# Patient Record
Sex: Female | Born: 1950 | Race: Black or African American | Hispanic: No | Marital: Single | Smoking: Never smoker
Health system: Southern US, Community
[De-identification: ages and names within clinical notes are randomized; demographics above are authoritative.]

## PROBLEM LIST (undated history)

## (undated) DIAGNOSIS — R7303 Prediabetes: Secondary | ICD-10-CM

## (undated) DIAGNOSIS — R0600 Dyspnea, unspecified: Secondary | ICD-10-CM

## (undated) DIAGNOSIS — N289 Disorder of kidney and ureter, unspecified: Secondary | ICD-10-CM

## (undated) DIAGNOSIS — D649 Anemia, unspecified: Secondary | ICD-10-CM

## (undated) DIAGNOSIS — Z87442 Personal history of urinary calculi: Secondary | ICD-10-CM

## (undated) DIAGNOSIS — E119 Type 2 diabetes mellitus without complications: Secondary | ICD-10-CM

## (undated) DIAGNOSIS — I10 Essential (primary) hypertension: Secondary | ICD-10-CM

## (undated) DIAGNOSIS — E039 Hypothyroidism, unspecified: Secondary | ICD-10-CM

---

## 2011-11-22 ENCOUNTER — Emergency Department (HOSPITAL_BASED_OUTPATIENT_CLINIC_OR_DEPARTMENT_OTHER)
Admission: EM | Admit: 2011-11-22 | Discharge: 2011-11-22 | Disposition: A | Discharge status: Other Acute Inpatient Hospital | Payer: BC Managed Care – PPO | Attending: Emergency Medicine | Admitting: Emergency Medicine

## 2011-11-22 ENCOUNTER — Emergency Department (HOSPITAL_BASED_OUTPATIENT_CLINIC_OR_DEPARTMENT_OTHER): Payer: BC Managed Care – PPO

## 2011-11-22 ENCOUNTER — Encounter (HOSPITAL_BASED_OUTPATIENT_CLINIC_OR_DEPARTMENT_OTHER): Payer: Self-pay | Admitting: *Deleted

## 2011-11-22 DIAGNOSIS — K5090 Crohn's disease, unspecified, without complications: Secondary | ICD-10-CM | POA: Insufficient documentation

## 2011-11-22 DIAGNOSIS — N289 Disorder of kidney and ureter, unspecified: Secondary | ICD-10-CM | POA: Insufficient documentation

## 2011-11-22 DIAGNOSIS — K529 Noninfective gastroenteritis and colitis, unspecified: Secondary | ICD-10-CM

## 2011-11-22 DIAGNOSIS — I10 Essential (primary) hypertension: Secondary | ICD-10-CM | POA: Insufficient documentation

## 2011-11-22 DIAGNOSIS — R55 Syncope and collapse: Secondary | ICD-10-CM | POA: Insufficient documentation

## 2011-11-22 DIAGNOSIS — R1900 Intra-abdominal and pelvic swelling, mass and lump, unspecified site: Secondary | ICD-10-CM | POA: Insufficient documentation

## 2011-11-22 DIAGNOSIS — Z79899 Other long term (current) drug therapy: Secondary | ICD-10-CM | POA: Insufficient documentation

## 2011-11-22 DIAGNOSIS — E209 Hypoparathyroidism, unspecified: Secondary | ICD-10-CM | POA: Insufficient documentation

## 2011-11-22 DIAGNOSIS — R51 Headache: Secondary | ICD-10-CM | POA: Insufficient documentation

## 2011-11-22 DIAGNOSIS — E119 Type 2 diabetes mellitus without complications: Secondary | ICD-10-CM | POA: Insufficient documentation

## 2011-11-22 HISTORY — DX: Essential (primary) hypertension: I10

## 2011-11-22 HISTORY — DX: Hypothyroidism, unspecified: E03.9

## 2011-11-22 HISTORY — DX: Type 2 diabetes mellitus without complications: E11.9

## 2011-11-22 LAB — URINALYSIS, ROUTINE W REFLEX MICROSCOPIC
Glucose, UA: NEGATIVE mg/dL
Specific Gravity, Urine: 1.044 — ABNORMAL HIGH (ref 1.005–1.030)

## 2011-11-22 LAB — GLUCOSE, CAPILLARY: Glucose-Capillary: 129 mg/dL — ABNORMAL HIGH (ref 70–99)

## 2011-11-22 LAB — CBC
MCH: 27 pg (ref 26.0–34.0)
MCHC: 33.4 g/dL (ref 30.0–36.0)
Platelets: 307 10*3/uL (ref 150–400)
RBC: 4.88 MIL/uL (ref 3.87–5.11)

## 2011-11-22 LAB — COMPREHENSIVE METABOLIC PANEL
ALT: 27 U/L (ref 0–35)
AST: 28 U/L (ref 0–37)
Alkaline Phosphatase: 108 U/L (ref 39–117)
CO2: 28 mEq/L (ref 19–32)
Calcium: 5.6 mg/dL — CL (ref 8.4–10.5)
Potassium: 3.2 mEq/L — ABNORMAL LOW (ref 3.5–5.1)
Sodium: 141 mEq/L (ref 135–145)
Total Protein: 8 g/dL (ref 6.0–8.3)

## 2011-11-22 LAB — URINE MICROSCOPIC-ADD ON

## 2011-11-22 LAB — TSH: TSH: 1.3 u[IU]/mL (ref 0.350–4.500)

## 2011-11-22 LAB — CALCIUM, IONIZED: Calcium, Ion: 0.59 mmol/L — CL (ref 1.12–1.32)

## 2011-11-22 LAB — PHOSPHORUS: Phosphorus: 6.2 mg/dL — ABNORMAL HIGH (ref 2.3–4.6)

## 2011-11-22 LAB — MAGNESIUM: Magnesium: 1.9 mg/dL (ref 1.5–2.5)

## 2011-11-22 MED ORDER — POTASSIUM CHLORIDE CRYS ER 20 MEQ PO TBCR
40.0000 meq | EXTENDED_RELEASE_TABLET | Freq: Once | ORAL | Status: AC
  Administered 2011-11-22: 40 meq via ORAL
  Filled 2011-11-22: qty 2

## 2011-11-22 MED ORDER — SODIUM CHLORIDE 0.9 % IV BOLUS (SEPSIS)
1000.0000 mL | Freq: Once | INTRAVENOUS | Status: AC
  Administered 2011-11-22: 1000 mL via INTRAVENOUS

## 2011-11-22 MED ORDER — IOHEXOL 300 MG/ML  SOLN
25.0000 mL | INTRAMUSCULAR | Status: AC
  Administered 2011-11-22 (×2): 25 mL via ORAL

## 2011-11-22 MED ORDER — IOHEXOL 300 MG/ML  SOLN
100.0000 mL | Freq: Once | INTRAMUSCULAR | Status: AC | PRN
  Administered 2011-11-22: 100 mL via INTRAVENOUS

## 2011-11-22 MED ORDER — CALCIUM GLUCONATE 10 % IV SOLN
1.0000 g | Freq: Once | INTRAVENOUS | Status: AC
  Administered 2011-11-22: 1 g via INTRAVENOUS
  Filled 2011-11-22: qty 10

## 2011-11-22 NOTE — ED Notes (Signed)
Angelique Blonder called back with Bed- 617 @ [facility] and [facility] is on their way for transport.

## 2011-11-22 NOTE — ED Notes (Signed)
Dr. Denton Lank has spoken to [facility] Dr. Harmon Dun at [facility] for admission.  Waiting to hear from [facility] on transportation.

## 2011-11-22 NOTE — ED Notes (Signed)
Pt incontinent loose stool in bed.  Provided skin care and changed linen.

## 2011-11-22 NOTE — ED Notes (Signed)
Patient had a seizure like activity for about 5 sec.  Patient's body became stiff and was foaming at the mouth.  Patient is now alert and oriented x 3.  No known history of seizure

## 2011-11-22 NOTE — ED Notes (Signed)
Patient made aware of urinalysis specimen

## 2011-11-22 NOTE — ED Provider Notes (Addendum)
History     CSN: [id number]  Arrival date & time 11/22/11  4098   None    Chief complaint: abdominal pain   (Consider location/radiation/quality/duration/timing/severity/associated sxs/prior treatment) Patient is a 61 y.o. female presenting with abdominal pain. The history is provided by the patient.  Abdominal Pain The primary symptoms of the illness include abdominal pain. The primary symptoms of the illness do not include fever, shortness of breath, vomiting, diarrhea or dysuria.  Symptoms associated with the illness do not include chills or back pain.  pt c/o mid to upper abd pain for past week, 'on and off'. Denies specific exacerbating or alleviating factors. Denies prior same pain. Lasts minutes to hours. No associated vomiting. States having regular bms, but small.  States feels if had large bm would feel better. Decreased appetite. Denies fever or chills. No gu c/o. No radiation of pain or back/flank pain. No hx pud, gallstones or pancreatitis. No prior abd surgery. No distension. Denies chest pain or sob.     Past Medical History  Diagnosis Date  . Hypertension   . Diabetes mellitus without complication     History reviewed. No pertinent past surgical history.  No family history on file.  History  Substance Use Topics  . Smoking status: Never Smoker   . Smokeless tobacco: Not on file  . Alcohol Use: No    OB History    Grav Para Term Preterm Abortions TAB SAB Ect Mult Living                  Review of Systems  Constitutional: Negative for fever and chills.  HENT: Negative for neck pain.   Eyes: Negative for redness.  Respiratory: Negative for cough and shortness of breath.   Cardiovascular: Negative for chest pain.  Gastrointestinal: Positive for abdominal pain. Negative for vomiting and diarrhea.  Genitourinary: Negative for dysuria and flank pain.  Musculoskeletal: Negative for back pain.  Skin: Negative for rash.  Neurological: Negative for  headaches.  Hematological: Does not bruise/bleed easily.  Psychiatric/Behavioral: Negative for confusion.    Allergies  Penicillins  Home Medications   Current Outpatient Rx  Name  Route  Sig  Dispense  Refill  . ATORVASTATIN CALCIUM 80 MG PO TABS   Oral   Take 80 mg by mouth daily.         Marland Kitchen CARVEDILOL 25 MG PO TABS   Oral   Take 25 mg by mouth 2 (two) times daily with a meal.         . LEVOTHYROXINE SODIUM 300 MCG PO TABS   Oral   Take 300 mcg by mouth daily.         Marland Kitchen LISINOPRIL 40 MG PO TABS   Oral   Take 40 mg by mouth daily.         Marland Kitchen SITAGLIPTIN PHOSPHATE 50 MG PO TABS   Oral   Take 50 mg by mouth daily.         Marland Kitchen LEVOFLOXACIN 500 MG PO TABS   Oral   Take 500 mg by mouth daily.         Marland Kitchen LEVOTHYROXINE SODIUM 200 MCG PO TABS   Oral   Take 200 mcg by mouth daily.         Marland Kitchen NITROFURANTOIN MACROCRYSTAL 100 MG PO CAPS   Oral   Take 100 mg by mouth 4 (four) times daily.           BP 159/96  Pulse 89  Temp  98.1 F (36.7 C) (Oral)  Resp 17  SpO2 96%  Physical Exam  Nursing note and vitals reviewed. Constitutional: She is oriented to person, place, and time. She appears well-developed and well-nourished. No distress.  HENT:  Mouth/Throat: Oropharynx is clear and moist.  Eyes: Conjunctivae normal are normal. No scleral icterus.  Neck: Neck supple. No tracheal deviation present.  Cardiovascular: Normal rate, regular rhythm, normal heart sounds and intact distal pulses.   Pulmonary/Chest: Effort normal and breath sounds normal. No respiratory distress.  Abdominal: Soft. Normal appearance and bowel sounds are normal. She exhibits no distension and no mass. There is no tenderness. There is no rebound and no guarding.  Genitourinary:       No cva tenderness  Musculoskeletal: She exhibits no edema and no tenderness.  Neurological: She is alert and oriented to person, place, and time.       Motor intact bil. Steady gait.   Skin: Skin is warm  and dry. No rash noted.  Psychiatric: She has a normal mood and affect.    ED Course  Procedures (including critical care time)  Results for orders placed during the hospital encounter of 11/22/11  GLUCOSE, CAPILLARY      Component Value Range   Glucose-Capillary 129 (*) 70 - 99 mg/dL   Comment 1 Documented in Chart     Comment 2 Notify RN    CBC      Component Value Range   WBC 17.6 (*) 4.0 - 10.5 K/uL   RBC 4.88  3.87 - 5.11 MIL/uL   Hemoglobin 13.2  12.0 - 15.0 g/dL   HCT 40.9  81.1 - 91.4 %   MCV 80.9  78.0 - 100.0 fL   MCH 27.0  26.0 - 34.0 pg   MCHC 33.4  30.0 - 36.0 g/dL   RDW 78.2  95.6 - 21.3 %   Platelets 307  150 - 400 K/uL  COMPREHENSIVE METABOLIC PANEL      Component Value Range   Sodium 141  135 - 145 mEq/L   Potassium 3.2 (*) 3.5 - 5.1 mEq/L   Chloride 94 (*) 96 - 112 mEq/L   CO2 28  19 - 32 mEq/L   Glucose, Bld 131 (*) 70 - 99 mg/dL   BUN 17  6 - 23 mg/dL   Creatinine, Ser 0.86 (*) 0.50 - 1.10 mg/dL   Calcium 5.6 (*) 8.4 - 10.5 mg/dL   Total Protein 8.0  6.0 - 8.3 g/dL   Albumin 3.0 (*) 3.5 - 5.2 g/dL   AST 28  0 - 37 U/L   ALT 27  0 - 35 U/L   Alkaline Phosphatase 108  39 - 117 U/L   Total Bilirubin 0.3  0.3 - 1.2 mg/dL   GFR calc non Af Amer 48 (*) >90 mL/min   GFR calc Af Amer 55 (*) >90 mL/min  LIPASE, BLOOD      Component Value Range   Lipase 17  11 - 59 U/L  MAGNESIUM      Component Value Range   Magnesium 1.9  1.5 - 2.5 mg/dL  PHOSPHORUS      Component Value Range   Phosphorus 6.2 (*) 2.3 - 4.6 mg/dL   Dg Abd 1 View  57/84/6962  *RADIOLOGY REPORT*  Clinical Data: Pain/vomiting  ABDOMEN - 1 VIEW  Comparison: None.  Findings: Nonspecific bowel gas pattern without a portion of small bowel dilatation to suggest small bowel obstruction.  Degenerative changes of the visualized thoracolumbar spine.  IMPRESSION: Unremarkable abdominal radiograph.   Original Report Authenticated By: Charline Bills, M.D.    Ct Abdomen Pelvis W  Contrast  11/22/2011  *RADIOLOGY REPORT*  Clinical Data: Abdominal pain  CT ABDOMEN AND PELVIS WITH CONTRAST  Technique:  Multidetector CT imaging of the abdomen and pelvis was performed following the standard protocol during bolus administration of intravenous contrast.  Contrast: OMNIPAQUE IOHEXOL 300 MG/ML  SOLN  Comparison: None.  Findings: Mild dependent atelectasis at the lung bases.  Liver, spleen, pancreas, and adrenal glands are within normal limits.  Gallbladder is unremarkable.  No intrahepatic or extrahepatic ductal dilatation.  Multiple complex/hyperdense renal lesions, including: --1.9 x 1.9 cm lesion in the right upper kidney (series 2/image 27) --1.2 x 1.3 cm lesion in the left upper kidney (series 2/image 28) --1.9 x 1.6 cm lesion in the left lower kidney (series 2/image 36)  All of these lesions could reflect hemorrhagic cysts but are considered indeterminate.  No hydronephrosis.  No evidence of bowel obstruction.  Abnormal wall thickening involving a long segment of distal small bowel including the terminal ileum (series 2/image 56).  Normal appendix.  Colonic diverticulosis, without definite associated inflammatory changes.  No evidence of abdominal aortic aneurysm.  Small retroperitoneal lymph nodes which do not meet pathologic CT size criteria.  Uterus is notable for calcified uterine fibroids.  Right ovary is unremarkable.  Complex left adnexal lesion measuring 4.5 x 6.0 cm with suspected associated hydrosalpinx (series 2/image 55).  Bladder is within normal limits.  Degenerative changes of the visualized thoracolumbar spine.  IMPRESSION: Abnormal distal small bowel, suspicious for infectious/inflammatory enteritis.  4.5 x 6.0 cm complex left adnexal lesion with suspected associated hydrosalpinx. Consider follow-up pelvic MRI with/without contrast (preferred) or ultrasound for further characterization.  Complex bilateral renal lesions, described above, possibly reflecting a hemorrhagic  cyst but indeterminate.  Follow-up MRI abdomen with/without contrast is suggested for definitive characterization.  These results were called by telephone on 11/22/2011 at 1150 hours to Dr Cathren Laine, who verbally acknowledged these results.   Original Report Authenticated By: Charline Bills, M.D.         MDM  Iv ns bolus. Labs.   Reviewed nursing notes and prior charts for additional history.   Just after iv stick, lab draw, pt indicate felt warm/hot, flushed, brief syncopal event. On entering room, pt awake and alert, no sz activity, no postictal confusion, pts mental status c/w baseline. Denies headache. Denies increased abd pain. No cp. No sob. No palpitations.  Monitor.   Ca low. Additional labs added. Ca gluconate iv.    Date: 11/22/2011  Rate: 79  Rhythm: normal sinus rhythm  QRS Axis: normal  Intervals: QT prolonged  ST/T Wave abnormalities: normal  Conduction Disutrbances:none  Narrative Interpretation:   Old EKG Reviewed: none available  Given abd pain, elevated wbc, will get ct.   Coworker arrives, provides additional hx, hx hypothyroid, on synthroid, pt unclear as to whether taking regularly. Will check tsh/t4.   Discussed diff dx w pt including hypoparathyroidism.   Iv fluids, ca in ed.   Recheck abd soft, mild mid to upper abd tenderness. No rebound or guarding.  No fevers. No hx ibd,   Discussed ct abnormalities noted w pt.   Pt states pcp is Dr Derrell Lolling w [facility], and that she prefers admission to [facility].  Discussed pt with Dr Leavy Cella, hospitalist on call, who accepts pt to [facility], she requests gen med bed.  Suzi Roots, MD 11/22/11 1239  Suzi Roots, MD 11/22/11 [id number]

## 2011-11-22 NOTE — ED Notes (Signed)
Patient transported to CT 

## 2011-11-22 NOTE — ED Notes (Signed)
Per family member, patient has not felt well all week.  Patient has been vomiting and stayed home.  The last time she was herself was last Saturday.  Speech is slightly slurred per family

## 2011-11-22 NOTE — ED Notes (Addendum)
Patient here with c/o not feeling like her self for about a week.  States that her head and her stomach do not feel right.  Patient denies any nausea or vomiting.  Patient states that her abdomen feels tight.  Patient denies any cold like symptoms.  Alert and oriented x 3.

## 2011-11-22 NOTE — ED Notes (Signed)
Paged Dr. Derrell Lolling ([facility]) for Dr. Denton Lank for admission at [facility]

## 2011-11-23 LAB — PARATHYROID HORMONE, INTACT (NO CA): PTH: 25.2 pg/mL (ref 14.0–72.0)

## 2011-11-23 LAB — GC/CHLAMYDIA PROBE AMP: GC Probe RNA: NEGATIVE

## 2011-11-24 LAB — URINE CULTURE: Colony Count: >=100000

## 2015-02-13 DIAGNOSIS — E892 Postprocedural hypoparathyroidism: Secondary | ICD-10-CM | POA: Insufficient documentation

## 2015-08-08 DIAGNOSIS — E890 Postprocedural hypothyroidism: Secondary | ICD-10-CM | POA: Insufficient documentation

## 2016-05-07 DIAGNOSIS — R7303 Prediabetes: Secondary | ICD-10-CM | POA: Insufficient documentation

## 2016-05-07 DIAGNOSIS — E669 Obesity, unspecified: Secondary | ICD-10-CM | POA: Insufficient documentation

## 2016-05-07 DIAGNOSIS — E782 Mixed hyperlipidemia: Secondary | ICD-10-CM | POA: Insufficient documentation

## 2016-05-07 DIAGNOSIS — I10 Essential (primary) hypertension: Secondary | ICD-10-CM | POA: Insufficient documentation

## 2016-05-08 DIAGNOSIS — N289 Disorder of kidney and ureter, unspecified: Secondary | ICD-10-CM | POA: Insufficient documentation

## 2016-05-08 DIAGNOSIS — E876 Hypokalemia: Secondary | ICD-10-CM | POA: Insufficient documentation

## 2016-05-26 ENCOUNTER — Ambulatory Visit (INDEPENDENT_AMBULATORY_CARE_PROVIDER_SITE_OTHER): Payer: Medicare Other | Admitting: Family Medicine

## 2016-05-26 ENCOUNTER — Encounter: Payer: Self-pay | Admitting: Family Medicine

## 2016-05-26 DIAGNOSIS — M7989 Other specified soft tissue disorders: Secondary | ICD-10-CM | POA: Diagnosis not present

## 2016-05-26 NOTE — Patient Instructions (Signed)
From an orthopedic standpoint you're doing ok. The fluid in your legs is likely due to a combination of veins not working well enough (venous insufficiency) and kidney insufficiency. Talk to your kidney doctor about whether you can start a fluid pill like lasix. Compression with stockings (typically 20-65mmHg pressure) helps keep this down. Icing or cool bath 15 minutes at a time 3-4 times a day. Elevation above your heart as much as possible. Follow up with me as needed.

## 2016-05-29 DIAGNOSIS — M7989 Other specified soft tissue disorders: Secondary | ICD-10-CM | POA: Insufficient documentation

## 2016-05-29 NOTE — Progress Notes (Signed)
PCP: Lilian Coma., MD  Subjective:   HPI: Patient is a 66 y.o. female here for bilateral foot pain.  Patient reports she's had several years of pain and swelling in both feet. Dating back to at least 2009. Both legs feel the same. Has difficulty walking. Pain can get to a 10/10 around both ankles. Currently 7/10 and stiff on right, 0/10 on left. She does have some shortness of breath when walking. No skin changes.  Past Medical History:  Diagnosis Date  . Diabetes mellitus without complication ([place])   . Hypertension   . Hypothyroid     Current Outpatient Prescriptions on File Prior to Visit  Medication Sig Dispense Refill  . atorvastatin (LIPITOR) 80 MG tablet Take 80 mg by mouth daily.    . carvedilol (COREG) 25 MG tablet Take 25 mg by mouth 2 (two) times daily with a meal.    . levothyroxine (SYNTHROID, LEVOTHROID) 200 MCG tablet Take 200 mcg by mouth daily.    Marland Kitchen lisinopril (PRINIVIL,ZESTRIL) 40 MG tablet Take 40 mg by mouth daily.    . sitaGLIPtin (JANUVIA) 50 MG tablet Take 50 mg by mouth daily.     No current facility-administered medications on file prior to visit.     No past surgical history on file.  Allergies  Allergen Reactions  . Penicillins     rash    Social History   Social History  . Marital status: Single    Spouse name: N/A  . Number of children: N/A  . Years of education: N/A   Occupational History  . Not on file.   Social History Main Topics  . Smoking status: Never Smoker  . Smokeless tobacco: Never Used  . Alcohol use No  . Drug use: Unknown  . Sexual activity: Not on file   Other Topics Concern  . Not on file   Social History Narrative  . No narrative on file    No family history on file.  BP (!) 151/94   Pulse 70   Ht 5\' 1"  (1.549 m)   Wt 220 lb (99.8 kg)   BMI 41.57 kg/m   Review of Systems: See HPI above.     Objective:  Physical Exam:  Gen: NAD, comfortable in exam room  Lungs: CTAB without wheezes,  rales, rhonchi.  Bilateral feet/ankles: 3+ pitting edema lower legs into feet and ankles.  No other deformity.  No bruising. FROM ankles without pain. 5/5 strength all motions also without pain. TTP mildly throughout lower legs. Negative ant drawer and talar tilt.   Negative syndesmotic compression. Thompsons test negative. NV intact distally.   Assessment & Plan:  1. Bilateral leg swelling - discussed with patient from orthopedic standpoint her exam is benign.  With MSK u/s also evaluated ankles and no evidence tenosynovitis, ankle effusions, other abnormalities.  She does have increased creatinine and likely venous insufficiency.  She is going to see a renal physician early next month - advised to discuss with them.  Lungs without rales to suggest CHF.  Consider lasix, compression stockings.  Icing, elevation.  Tylenol as needed for pain.  F/u prn.

## 2016-05-29 NOTE — Assessment & Plan Note (Signed)
discussed with patient from orthopedic standpoint her exam is benign.  With MSK u/s also evaluated ankles and no evidence tenosynovitis, ankle effusions, other abnormalities.  She does have increased creatinine and likely venous insufficiency.  She is going to see a renal physician early next month - advised to discuss with them.  Lungs without rales to suggest CHF.  Consider lasix, compression stockings.  Icing, elevation.  Tylenol as needed for pain.  F/u prn.

## 2018-12-16 ENCOUNTER — Other Ambulatory Visit: Payer: Self-pay | Admitting: Internal Medicine

## 2018-12-16 MED ORDER — OXYCODONE-ACETAMINOPHEN 5-325 MG PO TABS: 1.0000 | ORAL_TABLET | Freq: Four times a day (QID) | ORAL | 0 refills | Status: AC | PRN

## 2018-12-19 ENCOUNTER — Non-Acute Institutional Stay (SKILLED_NURSING_FACILITY): Payer: Medicare Other | Admitting: Internal Medicine

## 2018-12-19 ENCOUNTER — Encounter: Payer: Self-pay | Admitting: Internal Medicine

## 2018-12-19 DIAGNOSIS — E782 Mixed hyperlipidemia: Secondary | ICD-10-CM

## 2018-12-19 DIAGNOSIS — M1611 Unilateral primary osteoarthritis, right hip: Secondary | ICD-10-CM | POA: Diagnosis not present

## 2018-12-19 DIAGNOSIS — I10 Essential (primary) hypertension: Secondary | ICD-10-CM | POA: Diagnosis not present

## 2018-12-19 DIAGNOSIS — E890 Postprocedural hypothyroidism: Secondary | ICD-10-CM

## 2018-12-19 DIAGNOSIS — Z96641 Presence of right artificial hip joint: Secondary | ICD-10-CM

## 2018-12-19 DIAGNOSIS — E892 Postprocedural hypoparathyroidism: Secondary | ICD-10-CM

## 2018-12-19 DIAGNOSIS — N189 Chronic kidney disease, unspecified: Secondary | ICD-10-CM

## 2018-12-19 DIAGNOSIS — N179 Acute kidney failure, unspecified: Secondary | ICD-10-CM | POA: Diagnosis not present

## 2018-12-19 DIAGNOSIS — N3000 Acute cystitis without hematuria: Secondary | ICD-10-CM

## 2018-12-19 NOTE — Progress Notes (Signed)
: Provider:  Hennie Duos Location:  [facility] Room Number: 111-P Place of Service:  SNF (31)  PCP: Lilian Coma., MD Patient Care Team: Lilian Coma., MD as PCP - General (Internal Medicine)  Extended Emergency Contact Information Primary Emergency Contact: Rexene Alberts of [place] Phone: [phone] Relation: Friend     Allergies: Penicillins  Chief Complaint  Patient presents with  . New Admit To SNF    New admission to [facility] SNF    HPI: Patient is a 68 y.o. female with hypertension, prediabetes, stage IV chronic kidney disease, and hypothyroidism, who was admitted to [facility] from 12/7-11 for a planned right hip arthroplasty, secondary to end-stage osteoarthritis.  Hospital course was complicated issues with her renal insufficiency.  Her creatinine rose to 2.77 on her third postop day and IV fluids were continued.  On the fourth day her creatinine trended down to 1.76.  Patient also was diagnosed with a UTI and will be treated as an outpatient for same patient is admitted to skilled nursing facility for OT/PT.  While at skilled nursing facility patient will be followed for hypertension treated with Norvasc Coreg hydralazine and lisinopril, hypothyroidism treated with replacement, and hyperlipidemia treated with Lipitor.  Past Medical History:  Diagnosis Date  . Diabetes mellitus without complication ([place])   . Hypertension   . Hypothyroid     No past surgical history on file.  Allergies as of 12/19/2018      Reactions   Penicillins    rash      Medication List       Accurate as of December 19, 2018 10:17 AM. If you have any questions, ask your nurse or doctor.        STOP taking these medications   sitaGLIPtin 50 MG tablet Commonly known as: JANUVIA Stopped by: Inocencio Homes, MD     TAKE these medications   amlodipine-atorvastatin 10-10 MG tablet Commonly known as:  CADUET Take 1 tablet by mouth daily.   aspirin 81 MG chewable tablet Chew by mouth 2 (two) times daily.   atorvastatin 20 MG tablet Commonly known as: LIPITOR Take 20 mg by mouth daily. What changed: Another medication with the same name was removed. Continue taking this medication, and follow the directions you see here. Changed by: Inocencio Homes, MD   calcium carbonate 500 MG chewable tablet Commonly known as: TUMS - dosed in mg elemental calcium Chew 1 tablet by mouth 3 (three) times daily with meals.   calcium-vitamin D 500-200 MG-UNIT tablet Commonly known as: OSCAL WITH D Take 1 tablet by mouth 2 (two) times daily.   carvedilol 25 MG tablet Commonly known as: COREG Take 25 mg by mouth 2 (two) times daily with a meal.   hydrALAZINE 100 MG tablet Commonly known as: APRESOLINE Take 100 mg by mouth 3 (three) times daily.   levothyroxine 175 MCG tablet Commonly known as: SYNTHROID Take 175 mcg by mouth daily before breakfast. What changed: Another medication with the same name was removed. Continue taking this medication, and follow the directions you see here. Changed by: Inocencio Homes, MD   lisinopril 40 MG tablet Commonly known as: ZESTRIL Take 40 mg by mouth daily.   oxyCODONE-acetaminophen 5-325 MG tablet Commonly known as: PERCOCET/ROXICET Take 1 tablet by mouth every 6 (six) hours as needed for up to 7 days for severe pain.   sulfamethoxazole-trimethoprim 800-160 MG tablet Commonly known as: BACTRIM DS  Take 1 tablet by mouth 2 (two) times daily.   vitamin B-12 1000 MCG tablet Commonly known as: CYANOCOBALAMIN Take 1,000 mcg by mouth daily.       No orders of the defined types were placed in this encounter.   Immunization History  Administered Date(s) Administered  . PPD Test 05/04/2013    Social History   Tobacco Use  . Smoking status: Never Smoker  . Smokeless tobacco: Never Used  Substance Use Topics  . Alcohol use: No    Family  history is positive for heart disease in the mom and diabetes and cancer in the dad.  No family history on file.    Review of Systems  GENERAL:  no fevers, fatigue, appetite changes SKIN: Wound looks fine but there is clear drainage from it, without odor EYES: No eye pain, redness, discharge EARS: No earache, tinnitus, change in hearing NOSE: No congestion, drainage or bleeding  MOUTH/THROAT: No mouth or tooth pain, No sore throat RESPIRATORY: No cough, wheezing, SOB CARDIAC: No chest pain, palpitations, lower extremity edema  GI: No abdominal pain, No N/V/D or constipation, No heartburn or reflux  GU: No dysuria, frequency or urgency, or incontinence  MUSCULOSKELETAL: No unrelieved bone/joint pain NEUROLOGIC: No headache, dizziness or focal weakness PSYCHIATRIC: No c/o anxiety or sadness   Vitals:   12/19/18 0949  BP: 110/62  Pulse: 85  Resp: 18  Temp: 98.4 F (36.9 C)  SpO2: 97%    SpO2 Readings from Last 1 Encounters:  12/19/18 97%   Body mass index is 37.41 kg/m.     Physical Exam  GENERAL APPEARANCE: Alert, conversant,  No acute distress.  SKIN: Incision site dressed and not visualized HEAD: Normocephalic, atraumatic  EYES: Conjunctiva/lids clear. Pupils round, reactive. EOMs intact.  EARS: External exam WNL, canals clear. Hearing grossly normal.  NOSE: No deformity or discharge.  MOUTH/THROAT: Lips w/o lesions  RESPIRATORY: Breathing is even, unlabored. Lung sounds are clear   CARDIOVASCULAR: Heart RRR no murmurs, rubs or gallops. No peripheral edema.   GASTROINTESTINAL: Abdomen is soft, non-tender, not distended w/ normal bowel sounds. GENITOURINARY: Bladder non tender, not distended  MUSCULOSKELETAL: No abnormal joints or musculature NEUROLOGIC:  Cranial nerves 2-12 grossly intact. Moves all extremities  PSYCHIATRIC: Mood and affect appropriate to situation, no behavioral issues  Patient Active Problem List   Diagnosis Date Noted  . Leg swelling  05/29/2016  . Hypokalemia 05/08/2016  . Renal insufficiency 05/08/2016  . Mixed hyperlipidemia 05/07/2016  . Obesity (BMI 30-39.9) 05/07/2016  . Prediabetes 05/07/2016  . Uncontrolled hypertension 05/07/2016  . 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 Hypocalcemia 02/13/2015  . 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[place]) 02/13/2015      Labs reviewed: Basic Metabolic Panel:    Component Value Date/Time   NA 141 11/22/2011 0812   K 3.2 (L) 11/22/2011 0812   CL 94 (L) 11/22/2011 0812   CO2 28 11/22/2011 0812   GLUCOSE 131 (H) 11/22/2011 0812   BUN 17 11/22/2011 0812   CREATININE 1.20 (H) 11/22/2011 0812   CALCIUM 5.6 (LL) 11/22/2011 0812   PROT 8.0 11/22/2011 0812   ALBUMIN 3.0 (L) 11/22/2011 0812   AST 28 11/22/2011 0812   ALT 27 11/22/2011 0812   ALKPHOS 108 11/22/2011 0812   BILITOT 0.3 11/22/2011 0812   GFRNONAA 48 (L) 11/22/2011 0812   GFRAA 55 (L) 11/22/2011 0812    No results for input(s): NA, K, CL, CO2, GLUCOSE, BUN, CREATININE, CALCIUM, MG, PHOS in the last 8760 hours. Liver Function Tests: No  results for input(s): AST, ALT, ALKPHOS, BILITOT, PROT, ALBUMIN in the last 8760 hours. No results for input(s): LIPASE, AMYLASE in the last 8760 hours. No results for input(s): AMMONIA in the last 8760 hours. CBC: No results for input(s): WBC, NEUTROABS, HGB, HCT, MCV, PLT in the last 8760 hours. Lipid No results for input(s): CHOL, HDL, LDLCALC, TRIG in the last 8760 hours.  Cardiac Enzymes: No results for input(s): CKTOTAL, CKMB, CKMBINDEX, TROPONINI in the last 8760 hours. BNP: No results for input(s): BNP in the last 8760 hours. No results found for: MICROALBUR No results found for: HGBA1C Lab Results  Component Value Date   TSH 1.300 11/22/2011   No results found for: VITAMINB12 No results found for: FOLATE No results found for: IRON, TIBC, FERRITIN  Imaging and Procedures obtained prior to SNF admission: DG Abd 1 View  Result Date:  11/22/2011 *RADIOLOGY REPORT* Clinical Data: Pain/vomiting ABDOMEN - 1 VIEW Comparison: None. Findings: Nonspecific bowel gas pattern without a portion of small bowel dilatation to suggest small bowel obstruction. Degenerative changes of the visualized thoracolumbar spine. IMPRESSION: Unremarkable abdominal radiograph. Original Report Authenticated By: Julian Hy, M.D.   CT Abdomen Pelvis W Contrast  Result Date: 11/22/2011 *RADIOLOGY REPORT* Clinical Data: Abdominal pain CT ABDOMEN AND PELVIS WITH CONTRAST Technique:  Multidetector CT imaging of the abdomen and pelvis was performed following the standard protocol during bolus administration of intravenous contrast. Contrast: 125mL OMNIPAQUE IOHEXOL 300 MG/ML  SOLN Comparison: None. Findings: Mild dependent atelectasis at the lung bases. Liver, spleen, pancreas, and adrenal glands are within normal limits. Gallbladder is unremarkable.  No intrahepatic or extrahepatic ductal dilatation. Multiple complex/hyperdense renal lesions, including: --1.9 x 1.9 cm lesion in the right upper kidney (series 2/image 27) --1.2 x 1.3 cm lesion in the left upper kidney (series 2/image 28) --1.9 x 1.6 cm lesion in the left lower kidney (series 2/image 36) All of these lesions could reflect hemorrhagic cysts but are considered indeterminate.  No hydronephrosis. No evidence of bowel obstruction.  Abnormal wall thickening involving a long segment of distal small bowel including the terminal ileum (series 2/image 56).  Normal appendix.  Colonic diverticulosis, without definite associated inflammatory changes. No evidence of abdominal aortic aneurysm. Small retroperitoneal lymph nodes which do not meet pathologic CT size criteria. Uterus is notable for calcified uterine fibroids.  Right ovary is unremarkable. Complex left adnexal lesion measuring 4.5 x 6.0 cm with suspected associated hydrosalpinx (series 2/image 55). Bladder is within normal limits. Degenerative changes of the  visualized thoracolumbar spine. IMPRESSION: Abnormal distal small bowel, suspicious for infectious/inflammatory enteritis. 4.5 x 6.0 cm complex left adnexal lesion with suspected associated hydrosalpinx. Consider follow-up pelvic MRI with/without contrast (preferred) or ultrasound for further characterization. Complex bilateral renal lesions, described above, possibly reflecting a hemorrhagic cyst but indeterminate.  Follow-up MRI abdomen with/without contrast is suggested for definitive characterization. These results were called by telephone on 11/22/2011 at 1150 hours to Dr Lajean Saver, who verbally acknowledged these results. Original Report Authenticated By: Julian Hy, M.D.     Not all labs, radiology exams or other studies done during hospitalization come through on my EPIC note; however they are reviewed by me.    Assessment and Plan  Right hip arthritis/right hip arthroplasty-patient will be on aspirin 81 mg twice daily for 4 weeks duration.  Patient will wear TED hose during her 4-week post hospitalization SNF-admitted for OT/PT; prophylaxed with ASA 81 mg twice daily for 30 days.  Patient has had clear drainage from the wound  without odor or increased pain, the nurse here at SNF is already spoken to the do hypertension ctor's office.  Patient to follow-up with Dr. Davina Poke in 2 weeks  UTI SNF-patient be treated with Septra twice daily for 7 days  Acute kidney injury-creatinine went up to 2.77 and returned down to 1.7 SNF-follow-up BMP  Hypertension SNF-controlled; continue Norvasc 10 mg daily, Coreg 25 mg twice daily, hydralazine 100 mg 3 times daily and lisinopril 40 mg daily  Hypothyroidism/hypoparathyroidism-postprocedural SNF-not stated as uncontrolled; continue Synthroid 175 mcg daily   Hyperlipidemia SNF-not stated as uncontrolled; continue Lipitor 20 mg daily    Time spent greater than 45 minutes;> 50% of time with patient was spent reviewing records, labs, tests  and studies, counseling and developing plan of care  Hennie Duos, MD

## 2018-12-24 ENCOUNTER — Encounter: Payer: Self-pay | Admitting: Internal Medicine

## 2018-12-24 DIAGNOSIS — N189 Chronic kidney disease, unspecified: Secondary | ICD-10-CM | POA: Insufficient documentation

## 2018-12-24 DIAGNOSIS — Z96641 Presence of right artificial hip joint: Secondary | ICD-10-CM | POA: Insufficient documentation

## 2018-12-24 DIAGNOSIS — M1611 Unilateral primary osteoarthritis, right hip: Secondary | ICD-10-CM | POA: Insufficient documentation

## 2018-12-24 DIAGNOSIS — N179 Acute kidney failure, unspecified: Secondary | ICD-10-CM | POA: Insufficient documentation

## 2018-12-24 DIAGNOSIS — N390 Urinary tract infection, site not specified: Secondary | ICD-10-CM | POA: Insufficient documentation

## 2018-12-26 ENCOUNTER — Non-Acute Institutional Stay (SKILLED_NURSING_FACILITY): Payer: Medicare Other | Admitting: Internal Medicine

## 2018-12-26 DIAGNOSIS — N289 Disorder of kidney and ureter, unspecified: Secondary | ICD-10-CM | POA: Diagnosis not present

## 2019-01-02 ENCOUNTER — Encounter: Payer: Self-pay | Admitting: Internal Medicine

## 2019-01-02 NOTE — Progress Notes (Signed)
Location:   [facility] of Service:   SNF  Provider:Jameshia Hayashida D Sheppard Coil MD  Lilian Coma., MD  Patient Care Team: Lilian Coma., MD as PCP - General (Internal Medicine)  Extended Emergency Contact Information Primary Emergency Contact: Sigurd Sos [place] of [place] Phone: [phone] Relation: Friend    Allergies: Penicillins  Chief Complaint  Patient presents with  . Acute Visit    HPI: Patient is 68 y.o. female who is being seen because on a routine lab patient's creatinine is 1.82 with a scarcity of records and prior creatinines being anywhere from 1.2-1.76.  Past Medical History:  Diagnosis Date  . Diabetes mellitus without complication ([place])   . Hypertension   . Hypothyroid     No past surgical history on file.  Allergies as of 12/26/2018      Reactions   Penicillins    rash      Medication List       Accurate as of December 26, 2018 11:59 PM. If you have any questions, ask your nurse or doctor.        amlodipine-atorvastatin 10-10 MG tablet Commonly known as: CADUET Take 1 tablet by mouth daily.   aspirin 81 MG chewable tablet Chew by mouth 2 (two) times daily.   atorvastatin 20 MG tablet Commonly known as: LIPITOR Take 20 mg by mouth daily.   calcium carbonate 500 MG chewable tablet Commonly known as: TUMS - dosed in mg elemental calcium Chew 1 tablet by mouth 3 (three) times daily with meals.   calcium-vitamin D 500-200 MG-UNIT tablet Commonly known as: OSCAL WITH D Take 1 tablet by mouth 2 (two) times daily.   carvedilol 25 MG tablet Commonly known as: COREG Take 25 mg by mouth 2 (two) times daily with a meal.   hydrALAZINE 100 MG tablet Commonly known as: APRESOLINE Take 100 mg by mouth 3 (three) times daily.   levothyroxine 175 MCG tablet Commonly known as: SYNTHROID Take 175 mcg by mouth daily before breakfast.   lisinopril 40 MG tablet Commonly known as: ZESTRIL Take 40 mg by mouth daily.     vitamin B-12 1000 MCG tablet Commonly known as: CYANOCOBALAMIN Take 1,000 mcg by mouth daily.       No orders of the defined types were placed in this encounter.   Immunization History  Administered Date(s) Administered  . PPD Test 05/04/2013    Social History   Tobacco Use  . Smoking status: Never Smoker  . Smokeless tobacco: Never Used  Substance Use Topics  . Alcohol use: No    Review of Systems  GENERAL:  no fevers, fatigue, appetite changes SKIN: No itching, rash HEENT: No complaint RESPIRATORY: No cough, wheezing, SOB CARDIAC: No chest pain, palpitations, lower extremity edema  GI: No abdominal pain, No N/V/D or constipation, No heartburn or reflux  GU: No dysuria, frequency or urgency, or incontinence  MUSCULOSKELETAL: No unrelieved bone/joint pain NEUROLOGIC: No headache, dizziness  PSYCHIATRIC: No overt anxiety or sadness  Vitals:   01/02/19 0002  BP: 120/60  Pulse: 73  Resp: 18  Temp: 98 F (36.7 C)   There is no height or weight on file to calculate BMI. Physical Exam  GENERAL APPEARANCE: Alert, conversant, No acute distress  SKIN: No diaphoresis rash HEENT: Unremarkable RESPIRATORY: Breathing is even, unlabored. Lung sounds are clear   CARDIOVASCULAR: Heart RRR no murmurs, rubs or gallops. No peripheral edema  GASTROINTESTINAL: Abdomen is soft, non-tender, not distended w/ normal bowel  sounds.  GENITOURINARY: Bladder non tender, not distended  MUSCULOSKELETAL: No abnormal joints or musculature NEUROLOGIC: Cranial nerves 2-12 grossly intact. Moves all extremities PSYCHIATRIC: Mood and affect appropriate to situation, no behavioral issues  Patient Active Problem List   Diagnosis Date Noted  . Osteoarthritis of right hip 12/24/2018  . History of total right hip arthroplasty 12/24/2018  . Acute kidney injury superimposed on chronic kidney disease ([place]) 12/24/2018  . UTI (urinary tract infection) 12/24/2018  . Leg swelling 05/29/2016  .  Hypokalemia 05/08/2016  . Renal insufficiency 05/08/2016  . Mixed hyperlipidemia 05/07/2016  . Obesity (BMI 30-39.9) 05/07/2016  . Prediabetes 05/07/2016  . Hypertension 05/07/2016  . 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 Hypocalcemia 02/13/2015  . 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[facility]) 02/13/2015    CMP     Component Value Date/Time   NA 141 11/22/2011 0812   K 3.2 (L) 11/22/2011 0812   CL 94 (L) 11/22/2011 0812   CO2 28 11/22/2011 0812   GLUCOSE 131 (H) 11/22/2011 0812   BUN 17 11/22/2011 0812   CREATININE 1.20 (H) 11/22/2011 0812   CALCIUM 5.6 (LL) 11/22/2011 0812   PROT 8.0 11/22/2011 0812   ALBUMIN 3.0 (L) 11/22/2011 0812   AST 28 11/22/2011 0812   ALT 27 11/22/2011 0812   ALKPHOS 108 11/22/2011 0812   BILITOT 0.3 11/22/2011 0812   GFRNONAA 48 (L) 11/22/2011 0812   GFRAA 55 (L) 11/22/2011 0812   No results for input(s): NA, K, CL, CO2, GLUCOSE, BUN, CREATININE, CALCIUM, MG, PHOS in the last 8760 hours. No results for input(s): AST, ALT, ALKPHOS, BILITOT, PROT, ALBUMIN in the last 8760 hours. No results for input(s): WBC, NEUTROABS, HGB, HCT, MCV, PLT in the last 8760 hours. No results for input(s): CHOL, LDLCALC, TRIG in the last 8760 hours.  Invalid input(s): HCL No results found for: MICROALBUR Lab Results  Component Value Date   TSH 1.300 11/22/2011   No results found for: HGBA1C No results found for: CHOL, HDL, LDLCALC, LDLDIRECT, TRIG, CHOLHDL  Significant Diagnostic Results in last 30 days:  No results found.  Assessment and Plan  Elevated creatinine-it appears baseline for anywhere from 1.2-1.7; DC lisinopril 40 mg daily; start water I 120 cc every 2 hours x8 times a day while awake; repeat BMP on Monday  Hypocalcemia-patient's calcium is 7.1; patient is already on calcium 3 times daily; will continue to monitor  No problem-specific Assessment & Plan notes found for this encounter.   Labs/tests ordered:    Hennie Duos, MD

## 2019-01-04 ENCOUNTER — Encounter: Payer: Self-pay | Admitting: Internal Medicine

## 2019-01-04 ENCOUNTER — Non-Acute Institutional Stay (SKILLED_NURSING_FACILITY): Payer: Medicare Other | Admitting: Internal Medicine

## 2019-01-04 DIAGNOSIS — Z96641 Presence of right artificial hip joint: Secondary | ICD-10-CM

## 2019-01-04 DIAGNOSIS — N189 Chronic kidney disease, unspecified: Secondary | ICD-10-CM

## 2019-01-04 DIAGNOSIS — E782 Mixed hyperlipidemia: Secondary | ICD-10-CM

## 2019-01-04 DIAGNOSIS — I10 Essential (primary) hypertension: Secondary | ICD-10-CM

## 2019-01-04 DIAGNOSIS — N179 Acute kidney failure, unspecified: Secondary | ICD-10-CM

## 2019-01-04 NOTE — Progress Notes (Signed)
Location:    [facility] Room Number: 109/P Place of Service:  SNF ([id number])  Provider: Lorne Skeens  PCP: Lilian Coma., MD Patient Care Team: Lilian Coma., MD as PCP - General (Internal Medicine)  Extended Emergency Contact Information Primary Emergency Contact: Rexene Alberts of [place] Phone: [phone] Relation: Friend  Code Status: Full Code Goals of care:  Advanced Directive information Advanced Directives 01/04/2019  Does Patient Have a Medical Advance Directive? Yes  Type of Advance Directive (No Data)  Does patient want to make changes to medical advance directive? No - Patient declined     Allergies  Allergen Reactions  . Penicillins     rash    Discharge note  HPI:  68 y.o. female seen today for discharge from facility slated for January 06, 2019.  Patient will be going home she will need continued PT and OT as well as a rolling walker to assist with ambulation.  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  Her other conditions include stage IV chronic kidney disease as well as hypothyroidism hypertension and prediabetes.  She also experienced renal insufficiency in the hospital creatinine was up to 2.77 IV fluids were continued and her creatinine did trend down to 1.76 it actually was down to 1.45 on lab done on the 28th of this month.  She also was treated for UTI in the hospital and has completed her antibiotic in this regard.  Regards to hypertension she is on Norvasc Coreg hydralazine blood pressures recently 111/60-123/72.--Occasionally appears will have a systolic in the [id number]  Currently her only complaint is she feels she is having some increased generalized edema-it appears her weight has been relatively stable since her admission here hovering between 216-219 pounds  She does not complain of any chest pain or shortness of  breath    Past Medical History:  Diagnosis Date  . Diabetes mellitus without complication ([place])   . Hypertension   . Hypothyroid     No past surgical history on file.    reports that she has never smoked. She has never used smokeless tobacco. She reports that she does not drink alcohol. No history on file for drug. Social History   Socioeconomic History  . Marital status: Single    Spouse name: Not on file  . Number of children: Not on file  . Years of education: Not on file  . Highest education level: Not on file  Occupational History  . Not on file  Tobacco Use  . Smoking status: Never Smoker  . Smokeless tobacco: Never Used  Substance and Sexual Activity  . Alcohol use: No  . Drug use: Not on file  . Sexual activity: Not on file  Other Topics Concern  . Not on file  Social History Narrative  . Not on file   Social Determinants of Health   Financial Resource Strain:   . Difficulty of Paying Living Expenses: Not on file  Food Insecurity:   . Worried About Charity fundraiser in the Last Year: Not on file  . Ran Out of Food in the Last Year: Not on file  Transportation Needs:   . Lack of Transportation (Medical): Not on file  . Lack of Transportation (Non-Medical): Not on file  Physical Activity:   . Days of Exercise per Week: Not on file  . Minutes of Exercise per Session: Not on file  Stress:   . Feeling of Stress : Not on file  Social Connections:   . Frequency of Communication with Friends and Family: Not on file  . Frequency of Social Gatherings with Friends and Family: Not on file  . Attends Religious Services: Not on file  . Active Member of Clubs or Organizations: Not on file  . Attends Archivist Meetings: Not on file  . Marital Status: Not on file  Intimate Partner Violence:   . Fear of Current or Ex-Partner: Not on file  . Emotionally Abused: Not on file  . Physically Abused: Not on file  . Sexually Abused: Not on file   Functional  Status Survey:    Allergies  Allergen Reactions  . Penicillins     rash    Pertinent  Health Maintenance Due  Topic Date Due  . MAMMOGRAM  05/21/2000  . COLONOSCOPY  05/21/2000  . DEXA SCAN  05/22/2015  . PNA vac Low Risk Adult (1 of 2 - PCV13) 05/22/2015  . INFLUENZA VACCINE  08/06/2018    Medications: Allergies as of 01/04/2019      Reactions   Penicillins    rash      Medication List       Accurate as of January 04, 2019  2:25 PM. If you have any questions, ask your nurse or doctor.        STOP taking these medications   amlodipine-atorvastatin 10-10 MG tablet Commonly known as: CADUET Stopped by: Granville Lewis, PA-C   lisinopril 40 MG tablet Commonly known as: ZESTRIL Stopped by: Granville Lewis, PA-C     TAKE these medications   amLODipine 10 MG tablet Commonly known as: NORVASC Take 10 mg by mouth daily. For HTN   aspirin 81 MG chewable tablet Chew by mouth 2 (two) times daily.   atorvastatin 20 MG tablet Commonly known as: LIPITOR Take 20 mg by mouth daily.   calcium carbonate 500 MG chewable tablet Commonly known as: TUMS - dosed in mg elemental calcium Chew 1 tablet by mouth 3 (three) times daily with meals.   calcium-vitamin D 500-200 MG-UNIT tablet Commonly known as: OSCAL WITH D Take 1 tablet by mouth 2 (two) times daily.   carvedilol 25 MG tablet Commonly known as: COREG Take 25 mg by mouth 2 (two) times daily with a meal.   hydrALAZINE 100 MG tablet Commonly known as: APRESOLINE Take 100 mg by mouth 3 (three) times daily.   levothyroxine 175 MCG tablet Commonly known as: SYNTHROID Take 175 mcg by mouth daily before breakfast.   NON FORMULARY DIET: REGULAR, NAS/CCD   vitamin B-12 1000 MCG tablet Commonly known as: CYANOCOBALAMIN Take 1,000 mcg by mouth daily.       Review of Systems   In general she is not complaining of any fever or chills.  Skin does not complain of rashes or itching.-Surgical site right hip appears to  be healing unremarkably without signs of drainage or infection  Head ears eyes nose mouth and throat does not complain of visual changes or sore throat.  Respiratory does not complain of being short of breath or having increased cough.  Cardiac does not complain of chest pain-has some baseline edema she feels this has increased recently however.  GI is not complaining of abdominal discomfort nausea vomiting diarrhea constipation.  GU is not complaining of dysuria at this point has completed treatment for UTI.  Musculoskeletal has made progress with her therapy with a history of the right hip  replacement at this point is not complaining of pain.  Neurologic does not complain of dizziness headache numbness or syncope.  And psych is not complaining of being depressed or anxious.    Vitals:   01/04/19 1409  BP: (!) 153/89  Pulse: 75  Resp: 18  Temp: 98.2 F (36.8 C)  TempSrc: Oral  SpO2: 97%  Weight: 219 lb 3.2 oz (99.4 kg)  Height: 5\' 1"  (1.549 m)  Of note blood pressures continued somewhat variable with systolics ranging from 1 Q000111Q 50s Body mass index is 41.42 kg/m. Physical Exam In general this is a pleasant elderly female in no distress sitting comfortably in her chair.  Her skin is warm and dry.  Eyes visual acuity appears to be intact sclera and conjunctive are clear.  Oropharynx is clear mucous membranes moist.  Chest is clear to auscultation there is no labored breathing.  Heart is regular rate and rhythm without murmur gallop or rub she does have obese changes/edema a bit more on the right lower extremity versus the left pedal pulses are palpable.  Abdomen is obese soft nontender.  Musculoskeletal is able to move all extremities x4 it appears to have made nice progress status post surgery she does ambulate with her walker and is working with therapy-strength appears to be intact and appropriate all 4 extremities.  Neurologic is grossly intact her speech is  clear cannot appreciate any lateralizing findings.  Psych she is alert and oriented pleasant and appropriate.   Labs reviewed:  January 02, 2019.  Sodium 141 potassium 4.1 BUN 27.8 creatinine 1.45.  Previous creatinine was 1.82.  Calcium was 7.0.  December 24, 2018.  WBC 5.9 hemoglobin 9.4 platelets 297.   Basic Metabolic Panel: No results for input(s): NA, K, CL, CO2, GLUCOSE, BUN, CREATININE, CALCIUM, MG, PHOS in the last 8760 hours. Liver Function Tests: No results for input(s): AST, ALT, ALKPHOS, BILITOT, PROT, ALBUMIN in the last 8760 hours. No results for input(s): LIPASE, AMYLASE in the last 8760 hours. No results for input(s): AMMONIA in the last 8760 hours. CBC: No results for input(s): WBC, NEUTROABS, HGB, HCT, MCV, PLT in the last 8760 hours. Cardiac Enzymes: No results for input(s): CKTOTAL, CKMB, CKMBINDEX, TROPONINI in the last 8760 hours. BNP: Invalid input(s): POCBNP CBG: No results for input(s): GLUCAP in the last 8760 hours.  Procedures and Imaging Studies During Stay: No results found.  Assessment/Plan:   #1 history of right hip arthroplasty status post surgery-he appears to have made nice progress here-she is on aspirin 81 mg twice daily for prophylaxis for 30 days.  She is now ambulating with a walker and per therapy is doing well she will need continued PT and OT as well as a rolling walker secondary to her weakness status post surgery and fall risk.  2.  History of hypertension this appears stable although there is some variability at 1 point she was on lisinopril but this has been discontinued.  Recent blood pressures show variability but I do not see consistent elevations over 1 40--she continues on hydralazine 100 mg 3 times daily in addition to Coreg 25 mg twice daily and Norvasc 10 mg a day.  3.  History of stage IV chronic kidney disease this actually shown improvement with a creatinine of 1.45 on lab done 2 days ago-this will warrant  follow-up by primary care provider and I have spoken with the primary care provider's office who will be following up.  4.  History of hypothyroidism she continues on  Synthroid since her stay was short here will defer to primary care provider to follow-up on thyroid studies.  5.  History of hyperlipidemia she continues on atorvastatin 20 mg a day-again since her stay here was short will defer to primary care provider.  #6 history of hypocalcemia-she is on calcium supplementation-follow-up by primary care provider as needed    Clinically she appears to be stable-her weight has been stable but she says she is having some increased edema-will contact her primary care provider's office for possible input and follow-up .  [id number] note greater than 30 minutes spent on this discharge summary-greater than 50% of time spent coordinating a plan of care for numerous diagnoses

## 2019-01-05 ENCOUNTER — Non-Acute Institutional Stay (SKILLED_NURSING_FACILITY): Payer: Medicare Other | Admitting: Internal Medicine

## 2019-01-05 DIAGNOSIS — R609 Edema, unspecified: Secondary | ICD-10-CM

## 2019-01-06 ENCOUNTER — Encounter: Payer: Self-pay | Admitting: Internal Medicine

## 2019-01-06 MED ORDER — CARVEDILOL 25 MG PO TABS: 25.0000 mg | ORAL_TABLET | Freq: Two times a day (BID) | ORAL | 0 refills | Status: DC

## 2019-01-06 MED ORDER — VITAMIN B-12 1000 MCG PO TABS: 1000.0000 ug | ORAL_TABLET | Freq: Every day | ORAL | 0 refills | Status: DC

## 2019-01-06 MED ORDER — ATORVASTATIN CALCIUM 20 MG PO TABS: 20.0000 mg | ORAL_TABLET | Freq: Every day | ORAL | 0 refills | Status: DC

## 2019-01-06 MED ORDER — AMLODIPINE BESYLATE 10 MG PO TABS: 10.0000 mg | ORAL_TABLET | Freq: Every day | ORAL | 0 refills | Status: DC

## 2019-01-06 MED ORDER — LEVOTHYROXINE SODIUM 175 MCG PO TABS: 175.0000 ug | ORAL_TABLET | Freq: Every day | ORAL | 0 refills | Status: DC

## 2019-01-06 MED ORDER — FUROSEMIDE 20 MG PO TABS: 20.0000 mg | ORAL_TABLET | Freq: Every day | ORAL | 0 refills | Status: DC

## 2019-01-06 MED ORDER — HYDRALAZINE HCL 100 MG PO TABS: 100.0000 mg | ORAL_TABLET | Freq: Three times a day (TID) | ORAL | 0 refills | Status: DC

## 2019-01-06 NOTE — Progress Notes (Signed)
Location:  [facility] Room Number: 109-P Place of Service:  SNF [phone]) Provider:  Granville Lewis, PA-C  Patient Care Team: Lilian Coma., MD as PCP - General (Internal Medicine)  Extended Emergency Contact Information Primary Emergency Contact: Sigurd Sos [place] of [place] Phone: [phone] Relation: Friend  Code Status:  Full Code Goals of care: Advanced Directive information Advanced Directives 01/04/2019  Does Patient Have a Medical Advance Directive? Yes  Type of Advance Directive (No Data)  Does patient want to make changes to medical advance directive? No - Patient declined     Chief Complaint  Patient presents with  . Acute Visit  Follow-up edema  HPI:  Pt is a 69 y.o. female seen today for an acute visit for follow-up of edema.  Patient is about to be discharged from facility tomorrow-she has been here for rehab after undergoing an elective right hip arthroplasty secondary to end-stage osteoarthritis.  Her other diagnoses include hypertension chronic kidney disease hyperlipidemia hypothyroidism.  Patient has had some generalized edema she feels this has increased-her weight has been relatively stable it appears during her stay here but she is concerned about the edema-she does not complain of any chest pain or shortness of breath or increased weakness.  We were able to contact her primary care provider's office and she will see Dr. Karle Starch early next week.  Her office also stated that at 1 point she was around 198 pounds but this was sometime ago and before her surgery-her weights here have been between 216 and 219 pounds have been fairly consistent during her 2 weeks here.  She is not on a diuretic and says she is not really been on one in the past per her primary care provider's office.     Past Medical History:  Diagnosis Date  . Diabetes mellitus without complication ([place])   . Hypertension   . Hypothyroid    No past  surgical history on file.  Allergies  Allergen Reactions  . Latex Itching  . Nsaids Other (See Comments)    Contraindicated due to Kidney Disease  . Penicillins Rash    rash    Outpatient Encounter Medications as of 01/05/2019  Medication Sig  . amLODipine (NORVASC) 10 MG tablet Take 10 mg by mouth daily.   Marland Kitchen aspirin 81 MG chewable tablet Chew by mouth 2 (two) times daily.  Marland Kitchen atorvastatin (LIPITOR) 20 MG tablet Take 20 mg by mouth daily.  . calcium carbonate (TUMS - DOSED IN MG ELEMENTAL CALCIUM) 500 MG chewable tablet Chew 1 tablet by mouth 3 (three) times daily with meals.  . calcium-vitamin D (OSCAL WITH D) 500-200 MG-UNIT tablet Take 1 tablet by mouth 2 (two) times daily.  . carvedilol (COREG) 25 MG tablet Take 25 mg by mouth 2 (two) times daily with a meal.   . furosemide (LASIX) 20 MG tablet Take 20 mg by mouth daily.  . hydrALAZINE (APRESOLINE) 100 MG tablet Take 100 mg by mouth 3 (three) times daily.  Marland Kitchen levothyroxine (SYNTHROID) 175 MCG tablet Take 175 mcg by mouth daily before breakfast.  . NON FORMULARY DIET: REGULAR, NAS/CCD  . vitamin B-12 (CYANOCOBALAMIN) 1000 MCG tablet Take 1,000 mcg by mouth daily.   No facility-administered encounter medications on file as of 01/05/2019.    Review of Systems   In general she is not complain of any fever or chills.  Skin is not complaining of rashes or itching.  Head ears eyes nose mouth and throat is  not complain of visual changes or sore throat.  Respiratory denies shortness of breath or increased cough.  Cardiac does not complain of chest pain does have some edema.  GI is not complaining of abdominal discomfort nausea vomiting diarrhea constipation.  GU is not complaining of dysuria.  Musculoskeletal has gained some strength with rehab status post hip surgery is not complaining of pain currently.  Neurologic is not complaining of dizziness headache numbness or syncope.  And psych is not complaining of feeling  depressed or anxious.    Immunization History  Administered Date(s) Administered  . PPD Test 05/04/2013   Pertinent  Health Maintenance Due  Topic Date Due  . MAMMOGRAM  05/21/2000  . COLONOSCOPY  05/21/2000  . DEXA SCAN  05/22/2015  . PNA vac Low Risk Adult (1 of 2 - PCV13) 05/22/2015  . INFLUENZA VACCINE  08/06/2018   No flowsheet data found. Functional Status Survey:    Vitals:   01/05/19 0808  BP: 130/76  Pulse: 76  Resp: 17  Temp: 97.8 F (36.6 C)  TempSrc: Oral  Weight: 219 lb 3.2 oz (99.4 kg)  Height: 5\' 1"  (1.549 m)   Body mass index is 41.42 kg/m. Physical Exam In general this is a pleasant female in no distress lying comfortably in bed.  Her skin is warm and dry.  Eyes visual acuity appears to be intact sclera and conjunctive are clear.  Chest is clear to auscultation there is no labored breathing.  Heart is regular rate and rhythm without murmur gallop or rub.  Abdomen is obese soft nontender positive bowel sounds.  Musculoskeletal Limited exam since she is in bed but appears able to move her extremities at baseline continues with baseline edema relatively unchanged from yesterday's exam.  Neurologic is grossly intact her speech is clear cannot appreciate lateralizing findings.  Psych she is alert and oriented pleasant and appropriate.     Labs reviewed:  January 05, 2019.  WBC 4.8 hemoglobin 9.3 platelets 231  January 02, 2019.  Sodium 141 potassium 4.1 BUN 27.8 creatinine 1.45.  Previous creatinine was 1.82.  Calcium was 7.0.  December 24, 2018.  WBC 5.9 hemoglobin 9.4 platelets 297.   No results for input(s): NA, K, CL, CO2, GLUCOSE, BUN, CREATININE, CALCIUM, MG, PHOS in the last 8760 hours. No results for input(s): AST, ALT, ALKPHOS, BILITOT, PROT, ALBUMIN in the last 8760 hours. No results for input(s): WBC, NEUTROABS, HGB, HCT, MCV, PLT in the last 8760 hours. Lab Results  Component Value Date   TSH 1.300  11/22/2011   No results found for: HGBA1C No results found for: CHOL, HDL, LDLCALC, LDLDIRECT, TRIG, CHOLHDL  Significant Diagnostic Results in last 30 days:  No results found.  Assessment/Plan  #1 edema-appreciate input from primary care provider's office-I did discuss this with Dr. Sheppard Coil via phone-at this point will start her on low-dose Lasix 20 mg a day-she will have follow-up next week with her primary care provider and I have alerted them as well and they expressed understanding.  Clinically she continues to be stable without complaints of increased shortness of breath or chest pain.  #2 anemia-there may be an element of postop here-- hemoglobin was 9.3 which shows stability with previous lab done here-will warrant follow-up by primary care provider-  Lewis, PA-C [phone]

## 2019-01-08 ENCOUNTER — Encounter: Payer: Self-pay | Admitting: Internal Medicine

## 2019-02-01 ENCOUNTER — Other Ambulatory Visit: Payer: Self-pay | Admitting: Internal Medicine

## 2019-04-27 ENCOUNTER — Encounter: Payer: Self-pay | Admitting: Internal Medicine

## 2019-04-27 ENCOUNTER — Non-Acute Institutional Stay (SKILLED_NURSING_FACILITY): Payer: Medicare PPO | Admitting: Internal Medicine

## 2019-04-27 DIAGNOSIS — Z96642 Presence of left artificial hip joint: Secondary | ICD-10-CM

## 2019-04-27 DIAGNOSIS — N179 Acute kidney failure, unspecified: Secondary | ICD-10-CM | POA: Diagnosis not present

## 2019-04-27 DIAGNOSIS — N189 Chronic kidney disease, unspecified: Secondary | ICD-10-CM

## 2019-04-27 DIAGNOSIS — I10 Essential (primary) hypertension: Secondary | ICD-10-CM | POA: Diagnosis not present

## 2019-04-27 MED ORDER — OXYCODONE HCL 5 MG PO TABS
10.00 | ORAL_TABLET | ORAL | Status: DC
Start: ? — End: 2019-04-27

## 2019-04-27 MED ORDER — ALUM & MAG HYDROXIDE-SIMETH 200-200-20 MG/5ML PO SUSP
30.00 | ORAL | Status: DC
Start: ? — End: 2019-04-27

## 2019-04-27 MED ORDER — SENNOSIDES-DOCUSATE SODIUM 8.6-50 MG PO TABS
2.00 | ORAL_TABLET | ORAL | Status: DC
Start: 2019-04-27 — End: 2019-04-27

## 2019-04-27 MED ORDER — DIPHENHYDRAMINE HCL 25 MG PO CAPS
25.00 | ORAL_CAPSULE | ORAL | Status: DC
Start: ? — End: 2019-04-27

## 2019-04-27 MED ORDER — AMLODIPINE BESYLATE 5 MG PO TABS
10.00 | ORAL_TABLET | ORAL | Status: DC
Start: 2019-04-28 — End: 2019-04-27

## 2019-04-27 MED ORDER — CALCITRIOL 0.25 MCG PO CAPS
0.50 | ORAL_CAPSULE | ORAL | Status: DC
Start: 2019-04-27 — End: 2019-04-27

## 2019-04-27 MED ORDER — CYANOCOBALAMIN 1000 MCG PO TABS
1000.00 | ORAL_TABLET | ORAL | Status: DC
Start: 2019-04-28 — End: 2019-04-27

## 2019-04-27 MED ORDER — POLYSACCHARIDE IRON COMPLEX 150 MG PO CAPS
150.00 | ORAL_CAPSULE | ORAL | Status: DC
Start: 2019-04-28 — End: 2019-04-27

## 2019-04-27 MED ORDER — QUINTABS PO TABS
1.00 | ORAL_TABLET | ORAL | Status: DC
Start: 2019-04-28 — End: 2019-04-27

## 2019-04-27 MED ORDER — ENEMA 7-19 GM/118ML RE ENEM
1.00 | ENEMA | RECTAL | Status: DC
Start: ? — End: 2019-04-27

## 2019-04-27 MED ORDER — PANTOPRAZOLE SODIUM 40 MG PO TBEC
40.00 | DELAYED_RELEASE_TABLET | ORAL | Status: DC
Start: 2019-04-28 — End: 2019-04-27

## 2019-04-27 MED ORDER — ACETAMINOPHEN 500 MG PO TABS
1000.00 | ORAL_TABLET | ORAL | Status: DC
Start: 2019-04-27 — End: 2019-04-27

## 2019-04-27 MED ORDER — ASPIRIN 81 MG PO TBEC
81.00 | DELAYED_RELEASE_TABLET | ORAL | Status: DC
Start: 2019-04-27 — End: 2019-04-27

## 2019-04-27 MED ORDER — CALCIUM CARBONATE 1250 (500 CA) MG PO TABS
1250.00 | ORAL_TABLET | ORAL | Status: DC
Start: 2019-04-28 — End: 2019-04-27

## 2019-04-27 MED ORDER — CARVEDILOL 12.5 MG PO TABS
25.00 | ORAL_TABLET | ORAL | Status: DC
Start: 2019-04-27 — End: 2019-04-27

## 2019-04-27 MED ORDER — Medication
1.00 | Status: DC
Start: 2019-04-28 — End: 2019-04-27

## 2019-04-27 MED ORDER — TRAMADOL HCL 50 MG PO TABS
50.00 | ORAL_TABLET | ORAL | Status: DC
Start: ? — End: 2019-04-27

## 2019-04-27 MED ORDER — OYSTER SHELL CALCIUM/D 500-200 MG-UNIT PO TABS
1.00 | ORAL_TABLET | ORAL | Status: DC
Start: 2019-04-27 — End: 2019-04-27

## 2019-04-27 MED ORDER — HYDRALAZINE HCL 50 MG PO TABS
100.00 | ORAL_TABLET | ORAL | Status: DC
Start: 2019-04-27 — End: 2019-04-27

## 2019-04-27 MED ORDER — CHOLECALCIFEROL 25 MCG (1000 UT) PO TABS
1000.00 | ORAL_TABLET | ORAL | Status: DC
Start: 2019-04-28 — End: 2019-04-27

## 2019-04-27 MED ORDER — BISACODYL 10 MG RE SUPP
10.00 | RECTAL | Status: DC
Start: ? — End: 2019-04-27

## 2019-04-27 MED ORDER — GENERIC EXTERNAL MEDICATION
175.00 | Status: DC
Start: 2019-04-28 — End: 2019-04-27

## 2019-04-27 MED ORDER — SODIUM CHLORIDE 0.9 % IV SOLN
INTRAVENOUS | Status: DC
Start: ? — End: 2019-04-27

## 2019-04-27 MED ORDER — CYCLOBENZAPRINE HCL 10 MG PO TABS
10.00 | ORAL_TABLET | ORAL | Status: DC
Start: ? — End: 2019-04-27

## 2019-04-27 MED ORDER — DIPHENHYDRAMINE HCL 50 MG/ML IJ SOLN
25.00 | INTRAMUSCULAR | Status: DC
Start: ? — End: 2019-04-27

## 2019-04-27 MED ORDER — ASCORBIC ACID 500 MG PO TABS
500.00 | ORAL_TABLET | ORAL | Status: DC
Start: 2019-04-27 — End: 2019-04-27

## 2019-04-27 MED ORDER — CALCIUM CARBONATE 1250 (500 CA) MG PO CHEW
500.00 | CHEWABLE_TABLET | ORAL | Status: DC
Start: 2019-04-27 — End: 2019-04-27

## 2019-04-27 MED ORDER — PREGABALIN 75 MG PO CAPS
75.00 | ORAL_CAPSULE | ORAL | Status: DC
Start: 2019-04-28 — End: 2019-04-27

## 2019-04-27 MED ORDER — ATORVASTATIN CALCIUM 10 MG PO TABS
20.00 | ORAL_TABLET | ORAL | Status: DC
Start: 2019-04-28 — End: 2019-04-27

## 2019-04-27 NOTE — Progress Notes (Signed)
Location:    [facility] Room Number: [id number] Place of Service:  SNF (31) Provider:  Haynes Hoehn., MD  Patient Care Team: Lilian Coma., MD as PCP - General (Internal Medicine)  Extended Emergency Contact Information Primary Emergency Contact: Rexene Alberts of [place] Phone: [phone] Relation: Friend  Code Status:  Full Code Goals of care: Advanced Directive information Advanced Directives 04/27/2019  Does Patient Have a Medical Advance Directive? Yes  Type of Advance Directive (No Data)  Does patient want to make changes to medical advance directive? No - Patient declined     Chief Complaint  Patient presents with  . 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  She underwent a left total hip arthroplasty on April 24, 2019.  The procedure went well.  Postop her creatinine did start rising-she does have a history of chronic kidney disease as well.  She was given fluids and apparently on postop day 3 her creatinine had improved to 1.5.  And appears on discharge it was 2.23.  She is here for rehab she previously had her right hip repaired and had been here for rehab after that.  She is on aspirin 81 mg twice daily for 4 weeks for DVT prophylaxis she also has tramadol 50 mg every 6 hours as needed for pain.  Her other diagnoses include hypothyroidism she is on Synthroid-as well as hypertension she is on hydralazine 100 mg 3 times daily as well as Prinivil 40 mg a day and Coreg 25 mg twice daily in addition to Norvasc 10 mg a day.  She also has a history of low calcium and she is on numerous supplements including Tums Calcitrol and calcium with vitamin D.   Currently she is sitting in her chair comfortably does not really have any  Acute v complaints -- continues to be pleasant alert and very good spirits  Past Medical History:  Diagnosis Date  . Diabetes mellitus without complication ([place])   . Hypertension   . Hypothyroid    No past surgical history on file.  Allergies  Allergen Reactions  . Latex Itching  . Nsaids Other (See Comments)    Contraindicated due to Kidney Disease  . Penicillins Rash    rash    Allergies as of 04/27/2019      Reactions   Latex Itching   Nsaids Other (See Comments)   Contraindicated due to Kidney Disease   Penicillins Rash   rash      Medication List       Accurate as of April 27, 2019  3:17 PM. If you have any questions, ask your nurse or doctor.        STOP taking these medications   furosemide 20 MG tablet Commonly known as: LASIX Stopped by: Granville Lewis, PA-C     TAKE these medications   amLODipine 10 MG tablet Commonly known as: NORVASC Take 1 tablet (10 mg total) by mouth daily. For HTN   aspirin 81 MG chewable tablet Chew by mouth 2 (two) times daily.   atorvastatin 20 MG tablet Commonly known as: LIPITOR Take 1 tablet (20 mg total) by mouth daily.   calcitRIOL 0.5 MCG capsule Commonly known as: ROCALTROL Take 0.5 mcg  by mouth 2 (two) times daily. FOR HYPOCALCEMIA   calcium carbonate 500 MG chewable tablet Commonly known as: TUMS - dosed in mg elemental calcium Chew 1 tablet by mouth 3 (three) times daily with meals.   calcium-vitamin D 500-200 MG-UNIT tablet Commonly known as: OSCAL WITH D Take 1 tablet by mouth 2 (two) times daily.   carvedilol 25 MG tablet Commonly known as: COREG Take 1 tablet (25 mg total) by mouth 2 (two) times daily with a meal.   Dulcolax 10 MG suppository Generic drug: bisacodyl Place 10 mg rectally daily as needed for moderate constipation. FOR UP TO 10 DAYS (IF NO STOOL IN 24 HOURS WHILE RECIEVING OPIODS)   hydrALAZINE 100 MG tablet  Commonly known as: APRESOLINE Take 1 tablet (100 mg total) by mouth 3 (three) times daily.   levothyroxine 175 MCG tablet Commonly known as: SYNTHROID Take 1 tablet (175 mcg total) by mouth daily before breakfast.   lisinopril 40 MG tablet Commonly known as: ZESTRIL Take 40 mg by mouth daily.   NON FORMULARY DIET: REGULAR, NAS/CCD   traMADol 50 MG tablet Commonly known as: ULTRAM Take by mouth every 6 (six) hours as needed. FOR UP TO 7 DAYS FOR MODERATE PAIN   vitamin B-12 1000 MCG tablet Commonly known as: CYANOCOBALAMIN Take 1 tablet (1,000 mcg total) by mouth daily.       Review of Systems   In general she is not complaining of any fever or chills.  Skin does not complain of rashes or itching surgical site is currently covered.  Head ears eyes nose mouth and throat is not complaining of visual changes or sore throat.  Respiratory does not complain of being short of breath or having a cough.  Cardiac does not complain of chest pain or increased edema from baseline.  GI is not complaining of abdominal pain nausea vomiting diarrhea-she does say she has not had a bowel movement in a couple days  GU is not complaining of dysuria.  Musculoskeletal at this point does not really complain of pain she is status post left hip repair.  Neurologic does not complain of dizziness headache numbness or syncope.  And psych is not complaining of being depressed or anxious continues to be in very good spirits.    Immunization History  Administered Date(s) Administered  . Moderna SARS-COVID-2 Vaccination 01/05/2019, 02/02/2019  . PPD Test 05/04/2013   Pertinent  Health Maintenance Due  Topic Date Due  . MAMMOGRAM  Never done  . COLONOSCOPY  Never done  . DEXA SCAN  Never done  . PNA vac Low Risk Adult (1 of 2 - PCV13) Never done  . INFLUENZA VACCINE  08/06/2019   No flowsheet data found. Functional Status Survey:    Vitals:   04/27/19 1503  BP: 127/70  Pulse: 85   Resp: 16  Temp: 98.3 F (36.8 C)  TempSrc: Oral  SpO2: 93%  Weight: 219 lb 3.2 oz (99.4 kg)  Height: 5\' 1"  (1.549 m)   Body mass index is 41.42 kg/m. Physical Exam   In general this is a pleasant elderly female in no distress sitting comfortably in her chair.  Her skin is warm and dry surgical site is currently covered and was difficult to assess secondary to patient positioning.  Eyes visual acuity appears to be intact sclera and conjunctive are clear.  Oropharynx is clear mucous membranes moist.  Chest is clear to auscultation there is no labored breathing.  Heart is regular rate and rhythm  without murmur gallop or rub she has moderate lower extremity edema TED hose are in place.  Abdomen is obese soft nontender with positive bowel sounds.  Musculoskeletal Limited exam but she is able to move all extremities x4 with it appears some limitations of her left lower extremity status post surgery.  Pedal pulses intact left lower extremity surgical si perde  Neurologic is grossly intact cannot appreciate lateralizing findings her speech is clear.  Psych she is alert and oriented very pleasant and appropriate    Labs reviewed:  April 27, 2019.  Sodium 141 potassium 4.4 BUN 36 creatinine 2.23.  April 25, 2019.  WBC 7.3 hemoglobin 11.4 platelets 286.   No results for input(s): NA, K, CL, CO2, GLUCOSE, BUN, CREATININE, CALCIUM, MG, PHOS in the last 8760 hours. No results for input(s): AST, ALT, ALKPHOS, BILITOT, PROT, ALBUMIN in the last 8760 hours. No results for input(s): WBC, NEUTROABS, HGB, HCT, MCV, PLT in the last 8760 hours. Lab Results  Component Value Date   TSH 1.300 11/22/2011   No results found for: HGBA1C No results found for: CHOL, HDL, LDLCALC, LDLDIRECT, TRIG, CHOLHDL  Significant Diagnostic Results in last 30 days:  DG Abd 1 View  Result Date: 11/22/2011 *RADIOLOGY REPORT* Clinical Data: Pain/vomiting ABDOMEN - 1 VIEW Comparison: None. Findings:  Nonspecific bowel gas pattern without a portion of small bowel dilatation to suggest small bowel obstruction. Degenerative changes of the visualized thoracolumbar spine. IMPRESSION: Unremarkable abdominal radiograph. Original Report Authenticated By: Julian Hy, M.D.   CT Abdomen Pelvis W Contrast  Result Date: 11/22/2011 *RADIOLOGY REPORT* Clinical Data: Abdominal pain CT ABDOMEN AND PELVIS WITH CONTRAST Technique:  Multidetector CT imaging of the abdomen and pelvis was performed following the standard protocol during bolus administration of intravenous contrast. Contrast: 179mL OMNIPAQUE IOHEXOL 300 MG/ML  SOLN Comparison: None. Findings: Mild dependent atelectasis at the lung bases. Liver, spleen, pancreas, and adrenal glands are within normal limits. Gallbladder is unremarkable.  No intrahepatic or extrahepatic ductal dilatation. Multiple complex/hyperdense renal lesions, including: --1.9 x 1.9 cm lesion in the right upper kidney (series 2/image 27) --1.2 x 1.3 cm lesion in the left upper kidney (series 2/image 28) --1.9 x 1.6 cm lesion in the left lower kidney (series 2/image 36) All of these lesions could reflect hemorrhagic cysts but are considered indeterminate.  No hydronephrosis. No evidence of bowel obstruction.  Abnormal wall thickening involving a long segment of distal small bowel including the terminal ileum (series 2/image 56).  Normal appendix.  Colonic diverticulosis, without definite associated inflammatory changes. No evidence of abdominal aortic aneurysm. Small retroperitoneal lymph nodes which do not meet pathologic CT size criteria. Uterus is notable for calcified uterine fibroids.  Right ovary is unremarkable. Complex left adnexal lesion measuring 4.5 x 6.0 cm with suspected associated hydrosalpinx (series 2/image 55). Bladder is within normal limits. Degenerative changes of the visualized thoracolumbar spine. IMPRESSION: Abnormal distal small bowel, suspicious for  infectious/inflammatory enteritis. 4.5 x 6.0 cm complex left adnexal lesion with suspected associated hydrosalpinx. Consider follow-up pelvic MRI with/without contrast (preferred) or ultrasound for further characterization. Complex bilateral renal lesions, described above, possibly reflecting a hemorrhagic cyst but indeterminate.  Follow-up MRI abdomen with/without contrast is suggested for definitive characterization. These results were called by telephone on 11/22/2011 at 1150 hours to Dr Lajean Saver, who verbally acknowledged these results. Original Report Authenticated By: Julian Hy, M.D.    Assessment/Plan  #1 history of left hip repair-at this point appears to have tolerated procedure well she does have  aspirin 81 mg twice daily for 4 weeks for DVT prophylaxis as well as tramadol 50 mg every 6 hours as needed for pain.  She will need orthopedic follow-up as well as PT and OT.  2.  History of chronic kidney disease it appears her baseline creatinine is more in the mid ones it is 2.23 on hospital discharge it appears we will have this updated.  3.  History of hypertension she is on numerous medications including hydralazine 100 mg 3 times daily Prinivil 40 mg a day and Coreg 25 mg twice daily-at this point will monitor blood pressure today is 127/70.  4.  History of hypothyroidism not stated as uncontrolled she is on Synthroid 75 mcg a day.  5.  History of low calcium she is on supplements including Tums calcitriol and calcium and vitamin D again will update metabolic panel.  6.  Constipation--  I did speak with nursing about this she does have a Dulcolax suppository as needed-nursing will follow up on this.  7.  History of edema-she does have Lasix as needed again this is complicated somewhat with her renal issues at this point will monitor she does have TED hose on does not complain of any shortness of breath or chest pain.  [id number] note greater than 35 minutes spent on this  discharge summary greater than 50% of time spent coordinating a plan of care for numerous diagnoses:

## 2019-04-28 ENCOUNTER — Encounter: Payer: Self-pay | Admitting: Internal Medicine

## 2019-04-28 ENCOUNTER — Non-Acute Institutional Stay (SKILLED_NURSING_FACILITY): Payer: Medicare PPO | Admitting: Internal Medicine

## 2019-04-28 DIAGNOSIS — E890 Postprocedural hypothyroidism: Secondary | ICD-10-CM

## 2019-04-28 DIAGNOSIS — E782 Mixed hyperlipidemia: Secondary | ICD-10-CM

## 2019-04-28 DIAGNOSIS — Z96641 Presence of right artificial hip joint: Secondary | ICD-10-CM | POA: Diagnosis not present

## 2019-04-28 DIAGNOSIS — M1611 Unilateral primary osteoarthritis, right hip: Secondary | ICD-10-CM

## 2019-04-28 DIAGNOSIS — I10 Essential (primary) hypertension: Secondary | ICD-10-CM

## 2019-04-28 DIAGNOSIS — E892 Postprocedural hypoparathyroidism: Secondary | ICD-10-CM

## 2019-04-28 NOTE — Progress Notes (Signed)
: Provider:  Hennie Duos., MD Location:  [facility] Room Number: 513-P Place of Service:  SNF ([phone])  PCP: Hennie Duos, MD Patient Care Team: Hennie Duos, MD as PCP - General (Internal Medicine)  Extended Emergency Contact Information Primary Emergency Contact: Sigurd Sos [place] of [place] Phone: [phone] Relation: Friend     Allergies: Latex, Nsaids, and Penicillins  Chief Complaint  Patient presents with  . New Admit To SNF    New admission to [facility] SNF    HPI: Patient is a 69 y.o. female was admitted to [facility] from 4/19-22 for left hip osteoarthritis for total left hip arthroplasty under spinal anesthesia.  Postop course was complicated by the elevation of her creatinine which responded family to IV fluids and out cath and she was slated for aggressive physical therapy.  Patient is admitted to skilled nursing facility for OT/PT.  While at skilled nursing facility patient will be followed for hypertension treated with Norvasc Coreg, hydralazine and lisinopril and Lasix, hyperlipidemia treated with Lipitor and hypothyroidism treated with replacement.  Past Medical History:  Diagnosis Date  . Diabetes mellitus without complication ([place])   . Hypertension   . Hypothyroid     History reviewed. No pertinent surgical history.  Allergies as of 04/28/2019      Reactions   Latex Itching   Nsaids Other (See Comments)   Contraindicated due to Kidney Disease   Penicillins Rash   rash      Medication List       Accurate as of April 28, 2019  2:45 PM. If you have any questions, ask your nurse or doctor.        amLODipine 10 MG tablet Commonly known as: NORVASC Take 1 tablet (10 mg total) by mouth daily. For HTN   aspirin 81 MG chewable tablet Chew by mouth 2 (two) times daily.   atorvastatin 20 MG tablet Commonly known as: LIPITOR Take 1 tablet (20 mg total) by mouth daily.    calcitRIOL 0.5 MCG capsule Commonly known as: ROCALTROL Take 0.5 mcg by mouth 2 (two) times daily. FOR HYPOCALCEMIA   calcium carbonate 500 MG chewable tablet Commonly known as: TUMS - dosed in mg elemental calcium Chew 1 tablet by mouth 3 (three) times daily with meals.   calcium-vitamin D 500-200 MG-UNIT tablet Commonly known as: OSCAL WITH D Take 1 tablet by mouth 2 (two) times daily.   carvedilol 25 MG tablet Commonly known as: COREG Take 1 tablet (25 mg total) by mouth 2 (two) times daily with a meal.   Dulcolax 10 MG suppository Generic drug: bisacodyl Place 10 mg rectally daily as needed for moderate constipation. FOR UP TO 10 DAYS (IF NO STOOL IN 24 HOURS WHILE RECIEVING OPIODS)   hydrALAZINE 100 MG tablet Commonly known as: APRESOLINE Take 1 tablet (100 mg total) by mouth 3 (three) times daily.   levothyroxine 175 MCG tablet Commonly known as: SYNTHROID Take 1 tablet (175 mcg total) by mouth daily before breakfast.   lisinopril 40 MG tablet Commonly known as: ZESTRIL Take 40 mg by mouth daily.   NON FORMULARY DIET: REGULAR, NAS/CCD   traMADol 50 MG tablet Commonly known as: ULTRAM Take by mouth every 6 (six) hours as needed. FOR UP TO 7 DAYS FOR MODERATE PAIN   vitamin B-12 1000 MCG tablet Commonly known as: CYANOCOBALAMIN Take 1 tablet (1,000 mcg total) by mouth daily.  No orders of the defined types were placed in this encounter.   Immunization History  Administered Date(s) Administered  . Moderna SARS-COVID-2 Vaccination 01/05/2019, 02/02/2019  . PPD Test 05/04/2013    Social History   Tobacco Use  . Smoking status: Never Smoker  . Smokeless tobacco: Never Used  Substance Use Topics  . Alcohol use: No    Family history is mother with heart disease in dad with diabetes and cancer.  History reviewed. No pertinent family history.    Review of Systems  GENERAL:  no fevers, fatigue, appetite changes SKIN: No itching, or rash EYES:  No eye pain, redness, discharge EARS: No earache, tinnitus, change in hearing NOSE: No congestion, drainage or bleeding  MOUTH/THROAT: No mouth or tooth pain, No sore throat RESPIRATORY: No cough, wheezing, SOB CARDIAC: No chest pain, palpitations, lower extremity edema  GI: No abdominal pain, No N/V/D or constipation, No heartburn or reflux  GU: No dysuria, frequency or urgency, or incontinence  MUSCULOSKELETAL: No unrelieved bone/joint pain NEUROLOGIC: No headache, dizziness or focal weakness PSYCHIATRIC: No c/o anxiety or sadness   Vitals:   04/28/19 1441  BP: (!) 142/70  Pulse: 85  Resp: 17  Temp: 99.9 F (37.7 C)    SpO2 Readings from Last 1 Encounters:  04/27/19 93%   Body mass index is 41.23 kg/m.     Physical Exam  GENERAL APPEARANCE: Alert, conversant,  No acute distress.,  Obese black female SKIN: Incision area without heat or swelling HEAD: Normocephalic, atraumatic  EYES: Conjunctiva/lids clear. Pupils round, reactive. EOMs intact.  EARS: External exam WNL, canals clear. Hearing grossly normal.  NOSE: No deformity or discharge.  MOUTH/THROAT: Lips w/o lesions  RESPIRATORY: Breathing is even, unlabored. Lung sounds are clear   CARDIOVASCULAR: Heart RRR no murmurs, rubs or gallops. No peripheral edema.   GASTROINTESTINAL: Abdomen is soft, non-tender, not distended w/ normal bowel sounds. GENITOURINARY: Bladder non tender, not distended  MUSCULOSKELETAL: No abnormal joints or musculature NEUROLOGIC:  Cranial nerves 2-12 grossly intact. Moves all extremities  PSYCHIATRIC: Mood and affect appropriate to situation, no behavioral issues  Patient Active Problem List   Diagnosis Date Noted  . Osteoarthritis of right hip 12/24/2018  . History of total right hip arthroplasty 12/24/2018  . Acute kidney injury superimposed on chronic kidney disease ([place]) 12/24/2018  . UTI (urinary tract infection) 12/24/2018  . Leg swelling 05/29/2016  . Hypokalemia 05/08/2016  .  Renal insufficiency 05/08/2016  . Mixed hyperlipidemia 05/07/2016  . Obesity (BMI 30-39.9) 05/07/2016  . Prediabetes 05/07/2016  . Hypertension 05/07/2016  . 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 Hypocalcemia 02/13/2015  . 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      Labs reviewed: Basic Metabolic Panel:    Component Value Date/Time   NA 141 11/22/2011 0812   K 3.2 (L) 11/22/2011 0812   CL 94 (L) 11/22/2011 0812   CO2 28 11/22/2011 0812   GLUCOSE 131 (H) 11/22/2011 0812   BUN 17 11/22/2011 0812   CREATININE 1.20 (H) 11/22/2011 0812   CALCIUM 5.6 (LL) 11/22/2011 0812   PROT 8.0 11/22/2011 0812   ALBUMIN 3.0 (L) 11/22/2011 0812   AST 28 11/22/2011 0812   ALT 27 11/22/2011 0812   ALKPHOS 108 11/22/2011 0812   BILITOT 0.3 11/22/2011 0812   GFRNONAA 48 (L) 11/22/2011 0812   GFRAA 55 (L) 11/22/2011 0812    No results for input(s): NA, K, CL, CO2, GLUCOSE, BUN, CREATININE, CALCIUM, MG, PHOS in the last 8760 hours. Liver Function Tests: No results  for input(s): AST, ALT, ALKPHOS, BILITOT, PROT, ALBUMIN in the last 8760 hours. No results for input(s): LIPASE, AMYLASE in the last 8760 hours. No results for input(s): AMMONIA in the last 8760 hours. CBC: No results for input(s): WBC, NEUTROABS, HGB, HCT, MCV, PLT in the last 8760 hours. Lipid No results for input(s): CHOL, HDL, LDLCALC, TRIG in the last 8760 hours.  Cardiac Enzymes: No results for input(s): CKTOTAL, CKMB, CKMBINDEX, TROPONINI in the last 8760 hours. BNP: No results for input(s): BNP in the last 8760 hours. No results found for: MICROALBUR No results found for: HGBA1C Lab Results  Component Value Date   TSH 1.300 11/22/2011   No results found for: VITAMINB12 No results found for: FOLATE No results found for: IRON, TIBC, FERRITIN  Imaging and Procedures obtained prior to SNF admission: DG Abd 1 View  Result Date: 11/22/2011 *RADIOLOGY REPORT* Clinical Data: Pain/vomiting ABDOMEN - 1  VIEW Comparison: None. Findings: Nonspecific bowel gas pattern without a portion of small bowel dilatation to suggest small bowel obstruction. Degenerative changes of the visualized thoracolumbar spine. IMPRESSION: Unremarkable abdominal radiograph. Original Report Authenticated By: Julian Hy, M.D.   CT Abdomen Pelvis W Contrast  Result Date: 11/22/2011 *RADIOLOGY REPORT* Clinical Data: Abdominal pain CT ABDOMEN AND PELVIS WITH CONTRAST Technique:  Multidetector CT imaging of the abdomen and pelvis was performed following the standard protocol during bolus administration of intravenous contrast. Contrast: 115mL OMNIPAQUE IOHEXOL 300 MG/ML  SOLN Comparison: None. Findings: Mild dependent atelectasis at the lung bases. Liver, spleen, pancreas, and adrenal glands are within normal limits. Gallbladder is unremarkable.  No intrahepatic or extrahepatic ductal dilatation. Multiple complex/hyperdense renal lesions, including: --1.9 x 1.9 cm lesion in the right upper kidney (series 2/image 27) --1.2 x 1.3 cm lesion in the left upper kidney (series 2/image 28) --1.9 x 1.6 cm lesion in the left lower kidney (series 2/image 36) All of these lesions could reflect hemorrhagic cysts but are considered indeterminate.  No hydronephrosis. No evidence of bowel obstruction.  Abnormal wall thickening involving a long segment of distal small bowel including the terminal ileum (series 2/image 56).  Normal appendix.  Colonic diverticulosis, without definite associated inflammatory changes. No evidence of abdominal aortic aneurysm. Small retroperitoneal lymph nodes which do not meet pathologic CT size criteria. Uterus is notable for calcified uterine fibroids.  Right ovary is unremarkable. Complex left adnexal lesion measuring 4.5 x 6.0 cm with suspected associated hydrosalpinx (series 2/image 55). Bladder is within normal limits. Degenerative changes of the visualized thoracolumbar spine. IMPRESSION: Abnormal distal small  bowel, suspicious for infectious/inflammatory enteritis. 4.5 x 6.0 cm complex left adnexal lesion with suspected associated hydrosalpinx. Consider follow-up pelvic MRI with/without contrast (preferred) or ultrasound for further characterization. Complex bilateral renal lesions, described above, possibly reflecting a hemorrhagic cyst but indeterminate.  Follow-up MRI abdomen with/without contrast is suggested for definitive characterization. These results were called by telephone on 11/22/2011 at 1150 hours to Dr Lajean Saver, who verbally acknowledged these results. Original Report Authenticated By: Julian Hy, M.D.     Not all labs, radiology exams or other studies done during hospitalization come through on my EPIC note; however they are reviewed by me.    Assessment and Plan  Left hip osteoarthritis/status post arthroplasty-prophylaxis with baby aspirin twice daily for 4 weeks and TED hose for 4 weeks SNF-prophylaxis with ASA 81 mg twice daily and needs to wear TED hose daily for 4 weeks as prophylaxis  Hypertension SNF-controlled; continue Norvasc 10 mg daily, Coreg 25 mg twice daily,  hydralazine 100 mg 3 times daily, and lisinopril 40 mg daily   Hyperlipidemia SNF-not stated as uncontrolled; continue Lipitor 20 mg daily  Hypothyroidism SNF-not stated as uncontrolled; continue Synthroid 170 mcg daily no  Hypoparathyroidism SNF-postop; continue 0.5 mg twice daily   Time spent greater than 35 minutes;> 50% of time with patient was spent reviewing records, labs, tests and studies, counseling and developing plan of care  Hennie Duos, MD

## 2019-04-30 ENCOUNTER — Encounter: Payer: Self-pay | Admitting: Internal Medicine

## 2019-05-01 ENCOUNTER — Non-Acute Institutional Stay (SKILLED_NURSING_FACILITY): Payer: Medicare PPO | Admitting: Internal Medicine

## 2019-05-01 DIAGNOSIS — N289 Disorder of kidney and ureter, unspecified: Secondary | ICD-10-CM | POA: Diagnosis not present

## 2019-05-01 DIAGNOSIS — Z96641 Presence of right artificial hip joint: Secondary | ICD-10-CM

## 2019-05-01 DIAGNOSIS — D649 Anemia, unspecified: Secondary | ICD-10-CM | POA: Diagnosis not present

## 2019-05-01 DIAGNOSIS — I10 Essential (primary) hypertension: Secondary | ICD-10-CM | POA: Diagnosis not present

## 2019-05-01 NOTE — Progress Notes (Signed)
This is an acute visit.  Level of care skilled.  [facility]  Chief complaint acute visit follow-up chronic kidney disease-anemia-hypocalcemia.  History of present illness.  Patient is a very pleasant 69 year old [name] who is here for rehab after undergoing an elective left hip arthroplasty secondary to end-stage osteoarthritis.  Postop was complicated by rising creatinine which responded to IV fluids.  It appears at one point her creatinine was over 2 however on updated lab on April 23 they show significant improvement with a creatinine of 1.03 BUN of 24.3.  She also has postop anemia hemoglobin was 9.6 on April 23 lab it appears previously had been 11.4-unsure whether this was pre or postop.  MCV is low normal at 84.8.  She also has a history of chronic low calcium she is on significant calcium supplementation with Calcitrol Os-Cal as well as calcium.  Calcium shows improvement at 7.6 on the most recent lab.  Currently she is sitting in her chair comfortably she has just completed therapy and says she feels a bit tired but appears to be doing well pain appears to be controlled.   Vital signs appear to be stable  Past Medical History:  Diagnosis Date  . Diabetes mellitus without complication ([place])   . Hypertension   . Hypothyroid     History reviewed. No pertinent surgical history.       Allergies as of 04/28/2019      Reactions   Latex Itching   Nsaids Other (See Comments)   Contraindicated due to Kidney Disease   Penicillins Rash   rash         Medication List             amLODipine 10 MG tablet Commonly known as: NORVASC Take 1 tablet (10 mg total) by mouth daily. For HTN   aspirin 81 MG chewable tablet Chew by mouth 2 (two) times daily.   atorvastatin 20 MG tablet Commonly known as: LIPITOR Take 1 tablet (20 mg total) by mouth daily.   calcitRIOL 0.5 MCG capsule Commonly known as: ROCALTROL Take 0.5 mcg by  mouth 2 (two) times daily. FOR HYPOCALCEMIA   calcium carbonate 500 MG chewable tablet Commonly known as: TUMS - dosed in mg elemental calcium Chew 1 tablet by mouth 3 (three) times daily with meals.   calcium-vitamin D 500-200 MG-UNIT tablet Commonly known as: OSCAL WITH D Take 1 tablet by mouth 2 (two) times daily.   carvedilol 25 MG tablet Commonly known as: COREG Take 1 tablet (25 mg total) by mouth 2 (two) times daily with a meal.   Dulcolax 10 MG suppository Generic drug: bisacodyl Place 10 mg rectally daily as needed for moderate constipation. FOR UP TO 10 DAYS (IF NO STOOL IN 24 HOURS WHILE RECIEVING OPIODS)   hydrALAZINE 100 MG tablet Commonly known as: APRESOLINE Take 1 tablet (100 mg total) by mouth 3 (three) times daily.   levothyroxine 175 MCG tablet Commonly known as: SYNTHROID Take 1 tablet (175 mcg total) by mouth daily before breakfast.   lisinopril 40 MG tablet Commonly known as: ZESTRIL Take 40 mg by mouth daily.   NON FORMULARY DIET: REGULAR, NAS/CCD   traMADol 50 MG tablet Commonly known as: ULTRAM Take by mouth every 6 (six) hours as needed. FOR UP TO 7 DAYS FOR MODERATE PAIN   vitamin B-12 1000 MCG tablet Commonly known as: CYANOCOBALAMIN Take 1 tablet (1,000 mcg total) by mouth daily.  Immunization History  Administered Date(s) Administered  . Moderna SARS-COVID-2 Vaccination 01/05/2019, 02/02/2019  . PPD Test 05/04/2013    Social History   Tobacco Use  . Smoking status: Never Smoker  . Smokeless tobacco: Never Used  Substance Use Topics  . Alcohol use: No    Family history is mother with heart disease in dad with diabetes and cancer.  History reviewed. No pertinent family history.     Review of systems.  In general she not complaining of any fever or chills.  Skin does not complain of rashes or itching-  Head ears eyes nose mouth and throat is not complaining of visual changes or sore  throat.  Respiratory is not complaining of any shortness of breath or cough.  Cardiac does not complain of chest pain does have lower extremity edema relatively baseline she has TED hose on she does states she did take her as needed Lasix this morning.  GI is not complaining of abdominal pain nausea vomiting diarrhea or constipation.  GU does not complain of dysuria.  Musculoskeletal says her pain currently is controlled.  Neurologic does not complain of dizziness headache numbness does have lower extremity weakness status post surgery.  Psych does not complain of being depressed or anxious continues to be in good spirits.  Physical exam.  Temperature is 98.0 pulse 78 respirations 18 blood pressure 130/70.  In general this is a very pleasant elderly [name] no distress sitting comfortably in her chair.  Her skin is warm and dry surgical site difficult to assess secondary to patient positioning.-This is being followed by nursing.  Eyes visual acuity appears to be intact sclera and conjunctive are clear.  Oropharynx is clear mucous membranes moist.  Chest is clear to auscultation with somewhat shallow air entry there is no labored breathing.  Heart is regular rate and rhythm-without murmur gallop or rub she does have moderate edema with TED hose applied this appears relatively baseline  Abdomen is obese soft nontender with active bowel sounds.  Musculoskeletal is able to move all extremities x4 at relatively baseline.  With some limitations of her left lower extremity status post surgery.  Neurologic is grossly intact her speech is clear cannot really appreciate lateralizing findings.  Psych she is alert and oriented very pleasant and appropriate.  Labs.  April 28, 2019 sodium 144 potassium 4.1 BUN 24.3 creatinine 1.03.  Calcium 7.6-.  WBC 7.6 hemoglobin 9.6 platelets 197.  April 27, 2019.  Sodium 141 potassium 4.4 BUN 36 creatinine 2.23.  April 25, 2019.  WBC  7.3 hemoglobin 11.4 platelets 286.   Assessment and plan.  1.  History of renal insufficiency-creatinine appears to be significantly improved at 1.03 with a BUN of 24.3-she did receive IV fluids in the hospital.  This appears to be significant provement from hospital discharge lab at this point will monitor.-We will update this next week  2.  History of anemia most likely postop etiology hemoglobin is 9.6 this is down somewhat from her recent baseline will start iron 325 mg a day and monitor.  We will check a CBC in a week.  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  This shows improvement at 7.6 will have this updated next week.  4.  Hypertension this appears relatively stable on current medications including hydralazine 100 mg 3 times daily as well as lisinopril 40 mg a day Coreg  25 mg twice daily.   .    And Norvasc 10 mg a day.  Recent blood pressures 130/70-110/60-152/81-do not see consistent elevations at this time.  5.  History of edema she does have as needed Lasix she did get a dose this morning at this point will monitor at this point appears stable she is wearing TED hose does not complain of any increase shortness of breath.  6.  History of left hip arthroplasty-this appears to be stable-pain appears to be controlled-she is on aspirin 81 mg twice daily for 4 weeks for DVT prophylaxis  [id number]

## 2019-05-02 ENCOUNTER — Encounter: Payer: Self-pay | Admitting: Internal Medicine

## 2019-05-11 ENCOUNTER — Non-Acute Institutional Stay (SKILLED_NURSING_FACILITY): Payer: Medicare PPO | Admitting: Internal Medicine

## 2019-05-11 ENCOUNTER — Other Ambulatory Visit: Payer: Self-pay | Admitting: Internal Medicine

## 2019-05-11 DIAGNOSIS — N289 Disorder of kidney and ureter, unspecified: Secondary | ICD-10-CM

## 2019-05-11 DIAGNOSIS — M7989 Other specified soft tissue disorders: Secondary | ICD-10-CM

## 2019-05-11 DIAGNOSIS — I10 Essential (primary) hypertension: Secondary | ICD-10-CM | POA: Diagnosis not present

## 2019-05-11 DIAGNOSIS — Z96642 Presence of left artificial hip joint: Secondary | ICD-10-CM

## 2019-05-12 ENCOUNTER — Encounter: Payer: Self-pay | Admitting: Internal Medicine

## 2019-05-12 ENCOUNTER — Other Ambulatory Visit: Payer: Self-pay | Admitting: Internal Medicine

## 2019-05-12 MED ORDER — CARVEDILOL 25 MG PO TABS: 25.0000 mg | ORAL_TABLET | Freq: Two times a day (BID) | ORAL | 0 refills | Status: AC

## 2019-05-12 MED ORDER — VITAMIN B-12 1000 MCG PO TABS: 1000.0000 ug | ORAL_TABLET | Freq: Every day | ORAL | 0 refills | Status: DC

## 2019-05-12 MED ORDER — LEVOTHYROXINE SODIUM 175 MCG PO TABS: 175.0000 ug | ORAL_TABLET | Freq: Every day | ORAL | 0 refills | Status: DC

## 2019-05-12 MED ORDER — ATORVASTATIN CALCIUM 20 MG PO TABS: 20.0000 mg | ORAL_TABLET | Freq: Every day | ORAL | 0 refills | Status: AC

## 2019-05-12 MED ORDER — AMLODIPINE BESYLATE 10 MG PO TABS: 10.0000 mg | ORAL_TABLET | Freq: Every day | ORAL | 0 refills | Status: AC

## 2019-05-12 MED ORDER — LISINOPRIL 40 MG PO TABS: 40.0000 mg | ORAL_TABLET | Freq: Every day | ORAL | 0 refills | Status: AC

## 2019-05-12 MED ORDER — FUROSEMIDE 20 MG PO TABS: 20.0000 mg | ORAL_TABLET | Freq: Every day | ORAL | 0 refills | Status: DC | PRN

## 2019-05-12 MED ORDER — HYDRALAZINE HCL 100 MG PO TABS: 100.0000 mg | ORAL_TABLET | Freq: Three times a day (TID) | ORAL | 0 refills | Status: AC

## 2019-05-12 MED ORDER — CALCITRIOL 0.5 MCG PO CAPS: 0.5000 ug | ORAL_CAPSULE | Freq: Two times a day (BID) | ORAL | 0 refills | Status: AC

## 2019-05-12 MED ORDER — CALCIUM CARBONATE-VITAMIN D 500-200 MG-UNIT PO TABS: 1.0000 | ORAL_TABLET | Freq: Two times a day (BID) | ORAL | 0 refills | Status: DC

## 2019-05-12 NOTE — Progress Notes (Signed)
Location:    [facility] Room Number: 110/P Place of Service:  SNF (31)  Provider: Lorne Skeens  PCP: Hennie Duos, MD Patient Care Team: Hennie Duos, MD as PCP - General (Internal Medicine)  Extended Emergency Contact Information Primary Emergency Contact: Rexene Alberts of [place] Phone: [phone] Relation: Friend  Code Status: Full Code Goals of care:  Advanced Directive information Advanced Directives 05/12/2019  Does Patient Have a Medical Advance Directive? Yes  Type of Advance Directive (No Data)  Does patient want to make changes to medical advance directive? No - Patient declined     Allergies  Allergen Reactions  . Latex Itching  . Nsaids Other (See Comments)    Contraindicated due to Kidney Disease  . Penicillins Rash    rash    Chief Complaint  Patient presents with  . Discharge Note    Discharge Visit    HPI:  69 y.o. female seen today for discharge from facility which is scheduled for tomorrow Friday, May 12, 2019.  She has been here for short-term rehab after undergoing an elective left hip arthroplasty on April 24, 2019 secondary to end-stage osteoarthritis.  Procedure apparently went well.  Postop per creatinine did rise she does have a history of chronic kidney disease but apparently this will somewhat above her baseline.  She received IV fluids and apparently on postop day 3 her creatinine had improved to 1.5.  It appears on discharge it had risen to 2.23.  However subsequently we have done lab which showed improvement-in fact at 1 point it was down around 1 although I suspect this reflected the effect of the IV fluid-last creatinine was 1.43 which appears to be more her relative baseline-will warrant follow-up by primary care provider-and I did advise her to  let her primary care provider know so labs can be done expediently as an outpatient-she expressed understanding.  She  does occasionally take Lasix for lower extremity edema-she has been doing this apparently for a long time she says the occasional Lasix does help.  She recently saw orthopedics and was thought to be doing well-recommendation was to progress to weightbearing status.  She continues on aspirin 81 mg twice daily for DVT prophylaxis apparently she will be on this pill May 13.  Her pain appears to be controlled at 1 point she was on tramadol but apparently is not taking that anymore.  She has been started on iron with suspected postop anemia-hemoglobin was 9.8 on lab May 04, 2019-this appears to be stabilized  Regards to her other diagnoses which include hypertension-this appears relatively stable recent systolics have been in the 120-130 range occasionally somewhat higher but this does not appear to be consistent-I do note it was in the 140s today -she is on Coreg 25 mg twice daily as well as hydralazine 100 mg 3 times daily and lisinopril 40 mg a day in addition to Norvasc 10 mg a day.  She also has a history of low calcium and is on numerous supplements including Tums Calcitrol and calcium with vitamin D.  Calcium level on May 04, 2019 was 9.1.  She also has a history of hypothyroidism she is on Synthroid 175 mcg a day-since her stay here was short we were not aggressive pursuing a TSH-- this will need follow-up with primary care provider  Currently she is sitting in her chair comfortably-she does not have any acute complaints-she is looking forward to going home-will need rehab  and she apparently will be going to [facility] rehab center for therapy        Past Medical History:  Diagnosis Date  . Diabetes mellitus without complication ([place])   . Hypertension   . Hypothyroid     No past surgical history on file.    reports that she has never smoked. She has never used smokeless tobacco. She reports that she does not drink alcohol. No history on file for drug. Social History    Socioeconomic History  . Marital status: Single    Spouse name: Not on file  . Number of children: Not on file  . Years of education: Not on file  . Highest education level: Not on file  Occupational History  . Not on file  Tobacco Use  . Smoking status: Never Smoker  . Smokeless tobacco: Never Used  Substance and Sexual Activity  . Alcohol use: No  . Drug use: Not on file  . Sexual activity: Not on file  Other Topics Concern  . Not on file  Social History Narrative   Single. No children. Occasional drinker. Nonsmoker.    Social Determinants of Health   Financial Resource Strain:   . Difficulty of Paying Living Expenses:   Food Insecurity:   . Worried About Charity fundraiser in the Last Year:   . Arboriculturist in the Last Year:   Transportation Needs:   . Film/video editor (Medical):   Marland Kitchen Lack of Transportation (Non-Medical):   Physical Activity:   . Days of Exercise per Week:   . Minutes of Exercise per Session:   Stress:   . Feeling of Stress :   Social Connections:   . Frequency of Communication with Friends and Family:   . Frequency of Social Gatherings with Friends and Family:   . Attends Religious Services:   . Active Member of Clubs or Organizations:   . Attends Archivist Meetings:   Marland Kitchen Marital Status:   Intimate Partner Violence:   . Fear of Current or Ex-Partner:   . Emotionally Abused:   Marland Kitchen Physically Abused:   . Sexually Abused:    Functional Status Survey:    Allergies  Allergen Reactions  . Latex Itching  . Nsaids Other (See Comments)    Contraindicated due to Kidney Disease  . Penicillins Rash    rash    Pertinent  Health Maintenance Due  Topic Date Due  . MAMMOGRAM  Never done  . COLONOSCOPY  Never done  . DEXA SCAN  Never done  . PNA vac Low Risk Adult (1 of 2 - PCV13) Never done  . INFLUENZA VACCINE  08/06/2019    Medications: Allergies as of 05/11/2019      Reactions   Latex Itching   Nsaids Other (See  Comments)   Contraindicated due to Kidney Disease   Penicillins Rash   rash      Medication List       Accurate as of May 11, 2019 11:59 PM. If you have any questions, ask your nurse or doctor.        amLODipine 10 MG tablet Commonly known as: NORVASC Take 1 tablet (10 mg total) by mouth daily. For HTN   aspirin 81 MG chewable tablet Chew by mouth 2 (two) times daily.   atorvastatin 20 MG tablet Commonly known as: LIPITOR Take 1 tablet (20 mg total) by mouth daily.   calcitRIOL 0.5 MCG capsule Commonly known as: ROCALTROL Take 1  capsule (0.5 mcg total) by mouth 2 (two) times daily. FOR HYPOCALCEMIA   calcium carbonate 500 MG chewable tablet Commonly known as: TUMS - dosed in mg elemental calcium Chew 1 tablet by mouth 3 (three) times daily with meals.   calcium-vitamin D 500-200 MG-UNIT tablet Commonly known as: OSCAL WITH D Take 1 tablet by mouth 2 (two) times daily.   carvedilol 25 MG tablet Commonly known as: COREG Take 1 tablet (25 mg total) by mouth 2 (two) times daily with a meal.   Dulcolax 10 MG suppository Generic drug: bisacodyl Place 10 mg rectally daily as needed for moderate constipation. FOR UP TO 10 DAYS (IF NO STOOL IN 24 HOURS WHILE RECIEVING OPIODS)   ferrous sulfate 325 (65 FE) MG tablet Take 325 mg by mouth daily with breakfast. For Anemia   furosemide 20 MG tablet Commonly known as: LASIX Take 20 mg by mouth daily as needed (For Lower Leg Swelling).   hydrALAZINE 100 MG tablet Commonly known as: APRESOLINE Take 1 tablet (100 mg total) by mouth 3 (three) times daily.   levothyroxine 175 MCG tablet Commonly known as: SYNTHROID Take 1 tablet (175 mcg total) by mouth daily before breakfast.   lisinopril 40 MG tablet Commonly known as: ZESTRIL Take 1 tablet (40 mg total) by mouth daily.   NON FORMULARY DIET: REGULAR, NAS/CCD   vitamin B-12 1000 MCG tablet Commonly known as: CYANOCOBALAMIN Take 1 tablet (1,000 mcg total) by mouth  daily.       Review of Systems   General she not complaining of any fever or chills.  Skin is not complaining of rashes or itching-.  Head ears eyes nose mouth and throat is not complain of visual changes or sore throat.  Respiratory not complain of being short of breath or having a cough.  Cardiac does not complain of chest pain does have chronic lower extremity edema wears compression hose.  GI is not complaining of abdominal pain nausea vomiting diarrhea constipation.  GU no complaints of dysuria.  Musculoskeletal says her pain is controlled.  Neurologic does not complain of dizziness headache syncope or numbness.  And psych is not complaining of being depressed or anxious continues to be in her usual good spirits  Vitals:   05/12/19 0825  BP: (!) 149/79  Pulse: 77  Resp: 18  Temp: 97.9 F (36.6 C)  TempSrc: Oral  Weight: 211 lb (95.7 kg)  Height: 5\' 1"  (1.549 m)   Body mass index is 39.87 kg/m. Physical Exam   In general this is a very pleasant elderly female in no distress sitting comfortably in her chair.  Her skin is warm and dry surgical site left hip appears to have well-healed crusting Steri-Strips are in place I do not really note any drainage bleeding or concerning erythema.  Eyes visual acuity appears to be intact sclera and conjunctive are clear.  Oropharynx is clear mucous membranes moist.  Chest is clear to auscultation there is no labored breathing.  Heart is regular rate and rhythm without murmur gallop or rub-she has baseline lower extremity edema TED she is wearing compression hose-pedal pulses appear to be intact.  Abdomen is obese soft nontender with positive bowel sounds.  Musculoskeletal is able to move all extremities x4 it appears at relative baseline strength.  Neurologic appears grossly intact her speech is clear cannot appreciate lateralizing findings.  Psych she is alert and oriented very pleasant and appropriate  Labs  reviewed:  May 04, 2019.  WBC 7.4 hemoglobin  9.8 platelets 306.  Sodium 140 potassium 4.1 BUN 30.6 creatinine 1.43-calcium was 9.1.   April 28, 2019 sodium 144 potassium 4.1 BUN 24.3 creatinine 1.03.  Calcium 7.6-.  WBC 7.6 hemoglobin 9.6 platelets 197.  April 27, 2019.  Sodium 141 potassium 4.4 BUN 36 creatinine 2.23.  April 25, 2019.  WBC 7.3 hemoglobin 11.4 platelets 286.   Basic Metabolic Panel: No results for input(s): NA, K, CL, CO2, GLUCOSE, BUN, CREATININE, CALCIUM, MG, PHOS in the last 8760 hours. Liver Function Tests: No results for input(s): AST, ALT, ALKPHOS, BILITOT, PROT, ALBUMIN in the last 8760 hours. No results for input(s): LIPASE, AMYLASE in the last 8760 hours. No results for input(s): AMMONIA in the last 8760 hours. CBC: No results for input(s): WBC, NEUTROABS, HGB, HCT, MCV, PLT in the last 8760 hours. Cardiac Enzymes: No results for input(s): CKTOTAL, CKMB, CKMBINDEX, TROPONINI in the last 8760 hours. BNP: Invalid input(s): POCBNP CBG: No results for input(s): GLUCAP in the last 8760 hours.  Procedures and Imaging Studies During Stay: No results found.  Assessment/Plan:     #1 history of left hip repair she appears to be doing well with her rehab-she has seen orthopedics and is now weightbearing as tolerated.  She continues on aspirin 81 mg twice daily for DVT prophylaxis this is scheduled through May 13.  Pain appears to be controlled at 1 point she was on tramadol but does not appear to need this anymore which is encouraging.  She will need continued therapy which will be done apparently at the [facility] rehab center.  2.  History of chronic kidney disease-creatinine has shown some variability actually on hospital discharge was over 2-then it did drop down to almost 1 I suspect this reflected the IV fluids.  Most recent creatinine was 1.43 which appears to be more her relative baseline-this will warrant follow-up by primary  care provider and this was discussed with her as noted above.  3.  History of low calcium this appears to have normalized on calcium supplementation including Tums Calcitrol and calcium with vitamin D-this will warrant follow-up labs as well by primary care provider-she states she does have a history of chronic hypocalcemia.  4.  History of hypertension she has some variable blood pressures but I do not see consistent moles or elevations.  She is on lisinopril 40 mg a day hydralazine 100 mg 3 times daily Norvasc 10 mg a day as well as Coreg 25 mg twice daily.  5.  History of hypothyroidism she is on Synthroid 75 mcg a day-will defer follow-up to primary care provider.  6.  History of chronic edema she does have orders for as needed Lasix she has taken that occasionally here-she apparently has been doing this for some time-at this point edema appears to be relatively stable-she does wear compression hose.  7.  History of anemia suspect there is a postop etiology here she is on iron 325 mg a day-last hemoglobin was 9.8-this will warrant follow-up by primary care provider.   Again she will be going home-she is looking forward to this she will have rehab at [facility] rehab center-also will need expedient primary care follow-up which she is aware of and will follow up on.  [id number] note greater than 30 minutes spent on this discharge summary-greater than 50% of time spent coordinating plan of care for numerous diagnoses

## 2019-05-12 NOTE — Progress Notes (Signed)
This encounter was created in error - please disregard.

## 2019-05-16 ENCOUNTER — Other Ambulatory Visit: Payer: Self-pay | Admitting: Internal Medicine

## 2020-01-10 ENCOUNTER — Other Ambulatory Visit: Payer: Self-pay

## 2020-01-10 ENCOUNTER — Encounter (HOSPITAL_BASED_OUTPATIENT_CLINIC_OR_DEPARTMENT_OTHER): Payer: Self-pay

## 2020-01-10 ENCOUNTER — Inpatient Hospital Stay (HOSPITAL_BASED_OUTPATIENT_CLINIC_OR_DEPARTMENT_OTHER)
Admission: EM | Admit: 2020-01-10 | Discharge: 2020-01-16 | DRG: 660 | Disposition: A | Discharge status: Home / Self Care | Payer: Medicare PPO | Attending: Internal Medicine | Admitting: Internal Medicine

## 2020-01-10 ENCOUNTER — Emergency Department (HOSPITAL_BASED_OUTPATIENT_CLINIC_OR_DEPARTMENT_OTHER): Payer: Medicare PPO

## 2020-01-10 DIAGNOSIS — Z793 Long term (current) use of hormonal contraceptives: Secondary | ICD-10-CM | POA: Diagnosis not present

## 2020-01-10 DIAGNOSIS — E876 Hypokalemia: Secondary | ICD-10-CM | POA: Diagnosis present

## 2020-01-10 DIAGNOSIS — R7303 Prediabetes: Secondary | ICD-10-CM

## 2020-01-10 DIAGNOSIS — E892 Postprocedural hypoparathyroidism: Secondary | ICD-10-CM

## 2020-01-10 DIAGNOSIS — E782 Mixed hyperlipidemia: Secondary | ICD-10-CM | POA: Diagnosis not present

## 2020-01-10 DIAGNOSIS — Z7982 Long term (current) use of aspirin: Secondary | ICD-10-CM | POA: Diagnosis not present

## 2020-01-10 DIAGNOSIS — N136 Pyonephrosis: Secondary | ICD-10-CM | POA: Diagnosis not present

## 2020-01-10 DIAGNOSIS — E890 Postprocedural hypothyroidism: Secondary | ICD-10-CM | POA: Diagnosis present

## 2020-01-10 DIAGNOSIS — B964 Proteus (mirabilis) (morganii) as the cause of diseases classified elsewhere: Secondary | ICD-10-CM | POA: Diagnosis not present

## 2020-01-10 DIAGNOSIS — E860 Dehydration: Secondary | ICD-10-CM | POA: Diagnosis not present

## 2020-01-10 DIAGNOSIS — E1122 Type 2 diabetes mellitus with diabetic chronic kidney disease: Secondary | ICD-10-CM | POA: Diagnosis present

## 2020-01-10 DIAGNOSIS — I129 Hypertensive chronic kidney disease with stage 1 through stage 4 chronic kidney disease, or unspecified chronic kidney disease: Secondary | ICD-10-CM | POA: Diagnosis not present

## 2020-01-10 DIAGNOSIS — N3001 Acute cystitis with hematuria: Secondary | ICD-10-CM

## 2020-01-10 DIAGNOSIS — Z886 Allergy status to analgesic agent status: Secondary | ICD-10-CM

## 2020-01-10 DIAGNOSIS — E8729 Other acidosis: Secondary | ICD-10-CM

## 2020-01-10 DIAGNOSIS — E66813 Obesity, class 3: Secondary | ICD-10-CM

## 2020-01-10 DIAGNOSIS — N1831 Chronic kidney disease, stage 3a: Secondary | ICD-10-CM | POA: Insufficient documentation

## 2020-01-10 DIAGNOSIS — Z20822 Contact with and (suspected) exposure to covid-19: Secondary | ICD-10-CM | POA: Diagnosis not present

## 2020-01-10 DIAGNOSIS — N281 Cyst of kidney, acquired: Secondary | ICD-10-CM | POA: Diagnosis present

## 2020-01-10 DIAGNOSIS — R7881 Bacteremia: Secondary | ICD-10-CM | POA: Diagnosis not present

## 2020-01-10 DIAGNOSIS — N200 Calculus of kidney: Secondary | ICD-10-CM

## 2020-01-10 DIAGNOSIS — Z96641 Presence of right artificial hip joint: Secondary | ICD-10-CM | POA: Diagnosis present

## 2020-01-10 DIAGNOSIS — N201 Calculus of ureter: Secondary | ICD-10-CM | POA: Insufficient documentation

## 2020-01-10 DIAGNOSIS — E872 Acidosis: Secondary | ICD-10-CM | POA: Diagnosis not present

## 2020-01-10 DIAGNOSIS — Z79899 Other long term (current) drug therapy: Secondary | ICD-10-CM

## 2020-01-10 DIAGNOSIS — N179 Acute kidney failure, unspecified: Secondary | ICD-10-CM | POA: Diagnosis not present

## 2020-01-10 DIAGNOSIS — B9689 Other specified bacterial agents as the cause of diseases classified elsewhere: Secondary | ICD-10-CM | POA: Diagnosis present

## 2020-01-10 DIAGNOSIS — N202 Calculus of kidney with calculus of ureter: Secondary | ICD-10-CM | POA: Diagnosis not present

## 2020-01-10 DIAGNOSIS — Z880 Allergy status to penicillin: Secondary | ICD-10-CM

## 2020-01-10 DIAGNOSIS — N1832 Chronic kidney disease, stage 3b: Secondary | ICD-10-CM | POA: Diagnosis present

## 2020-01-10 DIAGNOSIS — N390 Urinary tract infection, site not specified: Secondary | ICD-10-CM

## 2020-01-10 DIAGNOSIS — Z6841 Body Mass Index (BMI) 40.0 and over, adult: Secondary | ICD-10-CM | POA: Diagnosis not present

## 2020-01-10 DIAGNOSIS — Z91040 Latex allergy status: Secondary | ICD-10-CM

## 2020-01-10 HISTORY — DX: Disorder of kidney and ureter, unspecified: N28.9

## 2020-01-10 LAB — BASIC METABOLIC PANEL
Anion gap: 23 — ABNORMAL HIGH (ref 5–15)
BUN: 115 mg/dL — ABNORMAL HIGH (ref 8–23)
CO2: 20 mmol/L — ABNORMAL LOW (ref 22–32)
Calcium: 5.8 mg/dL — CL (ref 8.9–10.3)
Chloride: 90 mmol/L — ABNORMAL LOW (ref 98–111)
Creatinine, Ser: 6.51 mg/dL — ABNORMAL HIGH (ref 0.44–1.00)
GFR, Estimated: 6 mL/min — ABNORMAL LOW (ref >60)
Glucose, Bld: 107 mg/dL — ABNORMAL HIGH (ref 70–99)
Potassium: 3.3 mmol/L — ABNORMAL LOW (ref 3.5–5.1)
Sodium: 133 mmol/L — ABNORMAL LOW (ref 135–145)

## 2020-01-10 LAB — I-STAT VENOUS BLOOD GAS, ED
Acid-base deficit: 4 mmol/L — ABNORMAL HIGH (ref 0.0–2.0)
Bicarbonate: 21.9 mmol/L (ref 20.0–28.0)
Calcium, Ion: 0.67 mmol/L — CL (ref 1.15–1.40)
HCT: 41 % (ref 36.0–46.0)
Hemoglobin: 13.9 g/dL (ref 12.0–15.0)
O2 Saturation: 56 %
Patient temperature: 98.2
Potassium: 3.2 mmol/L — ABNORMAL LOW (ref 3.5–5.1)
Sodium: 132 mmol/L — ABNORMAL LOW (ref 135–145)
TCO2: 23 mmol/L (ref 22–32)
pCO2, Ven: 42.7 mmHg — ABNORMAL LOW (ref 44.0–60.0)
pH, Ven: 7.317 (ref 7.250–7.430)
pO2, Ven: 31 mmHg — CL (ref 32.0–45.0)

## 2020-01-10 LAB — COMPREHENSIVE METABOLIC PANEL
ALT: 42 U/L (ref 0–44)
AST: 52 U/L — ABNORMAL HIGH (ref 15–41)
Albumin: 3.2 g/dL — ABNORMAL LOW (ref 3.5–5.0)
Alkaline Phosphatase: 97 U/L (ref 38–126)
Anion gap: 24 — ABNORMAL HIGH (ref 5–15)
BUN: 116 mg/dL — ABNORMAL HIGH (ref 8–23)
CO2: 18 mmol/L — ABNORMAL LOW (ref 22–32)
Calcium: 5.8 mg/dL — CL (ref 8.9–10.3)
Chloride: 90 mmol/L — ABNORMAL LOW (ref 98–111)
Creatinine, Ser: 6.42 mg/dL — ABNORMAL HIGH (ref 0.44–1.00)
GFR, Estimated: 7 mL/min — ABNORMAL LOW (ref >60)
Glucose, Bld: 108 mg/dL — ABNORMAL HIGH (ref 70–99)
Potassium: 2.9 mmol/L — ABNORMAL LOW (ref 3.5–5.1)
Sodium: 132 mmol/L — ABNORMAL LOW (ref 135–145)
Total Bilirubin: 0.7 mg/dL (ref 0.3–1.2)
Total Protein: 7.7 g/dL (ref 6.5–8.1)

## 2020-01-10 LAB — CBC
HCT: 39.2 % (ref 36.0–46.0)
Hemoglobin: 13.2 g/dL (ref 12.0–15.0)
MCH: 27.4 pg (ref 26.0–34.0)
MCHC: 33.7 g/dL (ref 30.0–36.0)
MCV: 81.5 fL (ref 80.0–100.0)
Platelets: 177 10*3/uL (ref 150–400)
RBC: 4.81 MIL/uL (ref 3.87–5.11)
RDW: 16 % — ABNORMAL HIGH (ref 11.5–15.5)
WBC: 14.3 10*3/uL — ABNORMAL HIGH (ref 4.0–10.5)
nRBC: 0 % (ref 0.0–0.2)

## 2020-01-10 LAB — URINALYSIS, ROUTINE W REFLEX MICROSCOPIC
Bilirubin Urine: NEGATIVE
Glucose, UA: NEGATIVE mg/dL
Ketones, ur: NEGATIVE mg/dL
Nitrite: NEGATIVE
Protein, ur: 30 mg/dL — AB
Specific Gravity, Urine: 1.01 (ref 1.005–1.030)
pH: 6.5 (ref 5.0–8.0)

## 2020-01-10 LAB — URINALYSIS, MICROSCOPIC (REFLEX): WBC, UA: >50 WBC/hpf (ref 0–5)

## 2020-01-10 LAB — RESP PANEL BY RT-PCR (FLU A&B, COVID) ARPGX2
Influenza A by PCR: NEGATIVE
Influenza B by PCR: NEGATIVE
SARS Coronavirus 2 by RT PCR: NEGATIVE

## 2020-01-10 LAB — LIPASE, BLOOD: Lipase: 19 U/L (ref 11–51)

## 2020-01-10 LAB — LACTIC ACID, PLASMA
Lactic Acid, Venous: 1.9 mmol/L (ref 0.5–1.9)
Lactic Acid, Venous: 1.4 mmol/L (ref 0.5–1.9)

## 2020-01-10 MED ORDER — CALCIUM GLUCONATE-NACL 1-0.675 GM/50ML-% IV SOLN
1.0000 g | Freq: Once | INTRAVENOUS | Status: AC
  Administered 2020-01-10: 1000 mg via INTRAVENOUS
  Filled 2020-01-10: qty 50

## 2020-01-10 MED ORDER — FENTANYL CITRATE (PF) 100 MCG/2ML IJ SOLN
50.0000 ug | Freq: Once | INTRAMUSCULAR | Status: DC
Start: 2020-01-10 — End: 2020-01-12
  Filled 2020-01-10: qty 2

## 2020-01-10 MED ORDER — SODIUM CHLORIDE 0.9 % IV SOLN
1.0000 g | Freq: Once | INTRAVENOUS | Status: AC
  Administered 2020-01-10: 1 g via INTRAVENOUS
  Filled 2020-01-10: qty 10

## 2020-01-10 MED ORDER — SODIUM CHLORIDE 0.9 % IV BOLUS
1000.0000 mL | Freq: Once | INTRAVENOUS | Status: AC
  Administered 2020-01-10: 1000 mL via INTRAVENOUS

## 2020-01-10 NOTE — ED Notes (Signed)
Pt here with complaints of abdominal pain since Monday. Pt has had diarrhea and vomiting. Last stool was today

## 2020-01-10 NOTE — ED Notes (Signed)
Pt to ER via transfer from [facility].  NADN.  Will continue to monitor.

## 2020-01-10 NOTE — ED Notes (Signed)
Patient transported to CT 

## 2020-01-10 NOTE — ED Triage Notes (Signed)
Pt c/o lower abdominal pain since first of week along with n/v/d. Pt states she has been unable to take her medication.

## 2020-01-10 NOTE — ED Notes (Signed)
Calcium 5.8 ED MD REES made aware

## 2020-01-10 NOTE — ED Notes (Signed)
Back in room. Resting comfortably. No acute distress noted.

## 2020-01-10 NOTE — Progress Notes (Signed)
Critical calcium value given to RN. .67

## 2020-01-10 NOTE — ED Provider Notes (Signed)
[facility] EMERGENCY DEPARTMENT Provider Note   CSN: [id number] Arrival date & time: 01/10/20  1619     History Chief Complaint  Patient presents with  . Abdominal Pain    Margaret [name] is a 70 y.o. female medical history of diabetes, hypertension, hypothyroidism, stage III kidney disease that presents the emerge department today for lower abdominal pain for the past week.  Also complains of nausea, vomiting and diarrhea.  States that she has NOT been able to tolerate p.o., has not been able to take her medications as directed.. States that abdominal pain is generalized, also complaining of flank pain.  Patient states that she has not been able to make urine in the past couple of days becaus she has not been able to eat or drink anything.  Has been vaccinated against Covid, has been boosted.  Denies any sick contacts.  Denies any fevers or chills.  Patient follows [facility] for her kidney disease.  States that she has not seen anyone over a week while she is been feeling ill..  Denies any chest pain or shortness of breath. No other complaints.   HPI     Past Medical History:  Diagnosis Date  . Diabetes mellitus without complication (HCC)   . Hypertension   . Hypothyroid   . Renal disorder     Patient Active Problem List   Diagnosis Date Noted  . Osteoarthritis of right hip 12/24/2018  . History of total right hip arthroplasty 12/24/2018  . Acute kidney injury superimposed on chronic kidney disease (HCC) 12/24/2018  . UTI (urinary tract infection) 12/24/2018  . Leg swelling 05/29/2016  . Hypokalemia 05/08/2016  . Renal insufficiency 05/08/2016  . Mixed hyperlipidemia 05/07/2016  . Obesity (BMI 30-39.9) 05/07/2016  . Prediabetes 05/07/2016  . Hypertension 05/07/2016  . 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 Hypocalcemia 02/13/2015  . 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HCC) 02/13/2015    History reviewed. No pertinent surgical history.   OB  History   No obstetric history on file.     History reviewed. No pertinent family history.  Social History   Tobacco Use  . Smoking status: Never Smoker  . Smokeless tobacco: Never Used  Substance Use Topics  . Alcohol use: Yes    Comment: rarely  . Drug use: Never    Home Medications Prior to Admission medications   Medication Sig Start Date End Date Taking? Authorizing Provider  amLODipine (NORVASC) 10 MG tablet Take 1 tablet (10 mg total) by mouth daily. For HTN 05/12/19  Yes Lassen, Arlo C, PA-C  atorvastatin (LIPITOR) 20 MG tablet Take 1 tablet (20 mg total) by mouth daily. 05/12/19  Yes Lassen, Arlo C, PA-C  calcitRIOL (ROCALTROL) 0.5 MCG capsule Take 1 capsule (0.5 mcg total) by mouth 2 (two) times daily. FOR HYPOCALCEMIA 05/12/19  Yes Lassen, Arlo C, PA-C  calcium-vitamin D (OSCAL WITH D) 500-200 MG-UNIT tablet Take 1 tablet by mouth 2 (two) times daily. 05/12/19  Yes Lassen, Arlo C, PA-C  carvedilol (COREG) 25 MG tablet Take 1 tablet (25 mg total) by mouth 2 (two) times daily with a meal. 05/12/19  Yes Lassen, Arlo C, PA-C  ferrous sulfate 325 (65 FE) MG tablet Take 325 mg by mouth daily with breakfast. For Anemia   Yes [provider]  furosemide (LASIX) 20 MG tablet Take 1 tablet (20 mg total) by mouth daily as needed (For Lower Leg Swelling). 05/12/19  Yes Lassen, Arlo C, PA-C  hydrALAZINE (APRESOLINE) 100 MG tablet Take 1  tablet (100 mg total) by mouth 3 (three) times daily. 05/12/19  Yes Wille Celeste, PA-C  levothyroxine (SYNTHROID) 175 MCG tablet Take 1 tablet (175 mcg total) by mouth daily before breakfast. 05/12/19  Yes Oscar La, Arlo C, PA-C  lisinopril (ZESTRIL) 40 MG tablet Take 1 tablet (40 mg total) by mouth daily. 05/12/19  Yes Wille Celeste, PA-C  NON FORMULARY DIET: REGULAR, NAS/CCD   Yes [provider]  vitamin B-12 (CYANOCOBALAMIN) 1000 MCG tablet Take 1 tablet (1,000 mcg total) by mouth daily. 05/12/19  Yes Oscar La, Arlo C, PA-C  aspirin 81 MG chewable  tablet Chew by mouth 2 (two) times daily.    [provider]  bisacodyl (DULCOLAX) 10 MG suppository Place 10 mg rectally daily as needed for moderate constipation. FOR UP TO 10 DAYS (IF NO STOOL IN 24 HOURS WHILE RECIEVING OPIODS)    [provider]  calcium carbonate (TUMS - DOSED IN MG ELEMENTAL CALCIUM) 500 MG chewable tablet Chew 1 tablet by mouth 3 (three) times daily with meals.    [provider]    Allergies    Latex, Nsaids, and Penicillins  Review of Systems   Review of Systems  Constitutional: Negative for chills, diaphoresis, fatigue and fever.  HENT: Negative for congestion, sore throat and trouble swallowing.   Eyes: Negative for pain and visual disturbance.  Respiratory: Negative for cough, shortness of breath and wheezing.   Cardiovascular: Negative for chest pain, palpitations and leg swelling.  Gastrointestinal: Positive for abdominal pain, diarrhea, nausea and vomiting. Negative for abdominal distention.  Genitourinary: Negative for difficulty urinating.  Musculoskeletal: Negative for back pain, neck pain and neck stiffness.  Skin: Negative for pallor.  Neurological: Negative for dizziness, speech difficulty, weakness and headaches.  Psychiatric/Behavioral: Negative for confusion.    Physical Exam Updated Vital Signs BP (!) 163/91   Pulse 77   Temp 98.2 F (36.8 C) (Oral)   Resp 19   Ht 5\' 1"  (1.549 m)   Wt 98.9 kg   SpO2 98%   BMI 41.19 kg/m   Physical Exam Constitutional:      General: She is in acute distress.     Appearance: Normal appearance. She is ill-appearing. She is not toxic-appearing or diaphoretic.  HENT:     Mouth/Throat:     Mouth: Mucous membranes are moist.     Pharynx: Oropharynx is clear.  Eyes:     General: No scleral icterus.    Extraocular Movements: Extraocular movements intact.     Pupils: Pupils are equal, round, and reactive to light.  Cardiovascular:     Rate and Rhythm: Normal rate and regular  rhythm.     Pulses: Normal pulses.     Heart sounds: Normal heart sounds.  Pulmonary:     Effort: Pulmonary effort is normal. No respiratory distress.     Breath sounds: Normal breath sounds. No stridor. No wheezing, rhonchi or rales.  Chest:     Chest wall: No tenderness.  Abdominal:     General: Abdomen is flat. There is no distension.     Palpations: Abdomen is soft.     Tenderness: There is generalized abdominal tenderness and tenderness in the suprapubic area. There is right CVA tenderness and left CVA tenderness. There is no guarding or rebound.  Musculoskeletal:        General: No swelling or tenderness. Normal range of motion.     Cervical back: Normal range of motion and neck supple. No rigidity.  Right lower leg: No edema.     Left lower leg: No edema.  Skin:    General: Skin is warm and dry.     Capillary Refill: Capillary refill takes less than 2 seconds.     Coloration: Skin is not pale.  Neurological:     General: No focal deficit present.     Mental Status: She is alert and oriented to person, place, and time.  Psychiatric:        Mood and Affect: Mood normal.        Behavior: Behavior normal.     ED Results / Procedures / Treatments   Labs (all labs ordered are listed, but only abnormal results are displayed) Labs Reviewed  COMPREHENSIVE METABOLIC PANEL - Abnormal; Notable for the following components:      Result Value   Sodium 132 (*)    Potassium 2.9 (*)    Chloride 90 (*)    CO2 18 (*)    Glucose, Bld 108 (*)    BUN 116 (*)    Creatinine, Ser 6.42 (*)    Calcium 5.8 (*)    Albumin 3.2 (*)    AST 52 (*)    GFR, Estimated 7 (*)    Anion gap 24 (*)    All other components within normal limits  CBC - Abnormal; Notable for the following components:   WBC 14.3 (*)    RDW 16.0 (*)    All other components within normal limits  URINALYSIS, ROUTINE W REFLEX MICROSCOPIC - Abnormal; Notable for the following components:   APPearance CLOUDY (*)    Hgb  urine dipstick MODERATE (*)    Protein, ur 30 (*)    Leukocytes,Ua LARGE (*)    All other components within normal limits  BASIC METABOLIC PANEL - Abnormal; Notable for the following components:   Sodium 133 (*)    Potassium 3.3 (*)    Chloride 90 (*)    CO2 20 (*)    Glucose, Bld 107 (*)    BUN 115 (*)    Creatinine, Ser 6.51 (*)    Calcium 5.8 (*)    GFR, Estimated 6 (*)    Anion gap 23 (*)    All other components within normal limits  URINALYSIS, MICROSCOPIC (REFLEX) - Abnormal; Notable for the following components:   Bacteria, UA MANY (*)    All other components within normal limits  I-STAT VENOUS BLOOD GAS, ED - Abnormal; Notable for the following components:   pCO2, Ven 42.7 (*)    pO2, Ven 31.0 (*)    Acid-base deficit 4.0 (*)    Sodium 132 (*)    Potassium 3.2 (*)    Calcium, Ion 0.67 (*)    All other components within normal limits  RESP PANEL BY RT-PCR (FLU A&B, COVID) ARPGX2  CULTURE, BLOOD (ROUTINE X 2)  URINE CULTURE  LIPASE, BLOOD  LACTIC ACID, PLASMA  LACTIC ACID, PLASMA  BLOOD GAS, VENOUS    EKG EKG Interpretation  Date/Time:  Wednesday January 10 2020 18:37:34 EST Ventricular Rate:  83 PR Interval:    QRS Duration: 92 QT Interval:  437 QTC Calculation: 514 R Axis:   73 Text Interpretation: Sinus rhythm Probable left atrial enlargement Prolonged QT interval Confirmed by Quintella Reichert [id number]) on 01/10/2020 6:41:04 PM   Radiology CT Abdomen Pelvis Wo Contrast  Result Date: 01/10/2020 CLINICAL DATA:  Lower abdominal pain. EXAM: CT ABDOMEN AND PELVIS WITHOUT CONTRAST TECHNIQUE: Multidetector CT imaging of the abdomen and pelvis  was performed following the standard protocol without IV contrast. COMPARISON:  September 20, 2016 FINDINGS: Lower chest: Very mild atelectasis is seen within the bilateral lung bases. Hepatobiliary: No focal liver abnormality is seen. No gallstones, gallbladder wall thickening, or biliary dilatation. Pancreas: Unremarkable. No  pancreatic ductal dilatation or surrounding inflammatory changes. Spleen: Normal in size without focal abnormality. Adrenals/Urinary Tract: Adrenal glands are unremarkable. Kidneys are normal in size. A 2.4 cm x 1.7 cm well-defined, exophytic area of low attenuation is seen within the anteromedial aspect of the mid to upper right kidney. This area is decreased in size when compared to the prior study. A stable 2.6 cm x 1.9 cm, exophytic, well-defined mildly hyperdense area (approximately 39.78 Hounsfield units) is seen along the posterior aspect of the mid left kidney. 3 mm calcifications are seen within the medial aspect of the mid left kidney. Very mild diffuse calcification of the renal pyramids is also noted on the left. A 7 mm obstructing renal stone is noted within the proximal left ureter with moderate severity left-sided hydronephrosis and hydroureter. A Foley catheter is seen within an empty urinary bladder. Stomach/Bowel: There is a small hiatal hernia. Appendix appears normal. No evidence of bowel dilatation. Noninflamed diverticula are seen within the distal descending and sigmoid colon. Vascular/Lymphatic: No significant vascular findings are present. No enlarged abdominal or pelvic lymph nodes. Reproductive: The uterus is not clearly identified. This area is limited in evaluation secondary to the presence of overlying streak artifact. Other: A 2.6 cm x 1.7 cm fat containing umbilical hernia is seen. There is no evidence of abdominal or pelvic free fluid. Musculoskeletal: Bilateral total hip replacements are seen with an extensive amount of associated streak artifact. Subsequently limited evaluation of the adjacent osseous and soft tissue structures is noted. IMPRESSION: 1. 7 mm obstructing renal stone within the proximal left ureter. 2. Findings likely consistent with complex bilateral renal cysts. This corresponds to the findings seen on the prior abdomen and pelvis CT and prior renal ultrasound. A  hemorrhagic component involving the lesion within the left kidney cannot be excluded. MRI correlation is recommended to further characterize these areas. 3. Colonic diverticulosis. 4. Small hiatal hernia. 5. Fat-containing umbilical hernia. 6. Bilateral total hip replacements. Electronically Signed   By: Virgina Norfolk M.D.   On: 01/10/2020 20:06    Procedures .Critical Care Performed by: Alfredia Client, PA-C Authorized by: Alfredia Client, PA-C   Critical care provider statement:    Critical care time (minutes):  45   Critical care was time spent personally by me on the following activities:  Discussions with consultants, evaluation of patient's response to treatment, examination of patient, ordering and performing treatments and interventions, ordering and review of laboratory studies, ordering and review of radiographic studies, pulse oximetry, re-evaluation of patient's condition, obtaining history from patient or surrogate and review of old charts   (including critical care time)  Medications Ordered in ED Medications  fentaNYL (SUBLIMAZE) injection 50 mcg (50 mcg Intravenous Patient Refused/Not Given 01/10/20 1931)  sodium chloride 0.9 % bolus 1,000 mL (0 mLs Intravenous Stopped 01/10/20 2046)  calcium gluconate 1 g/ 50 mL sodium chloride IVPB (0 g Intravenous Stopped 01/10/20 2046)  cefTRIAXone (ROCEPHIN) 1 g in sodium chloride 0.9 % 100 mL IVPB (0 g Intravenous Stopped 01/10/20 2356)    ED Course  I have reviewed the triage vital signs and the nursing notes.  Pertinent labs & imaging results that were available during my care of the patient were reviewed by me  and considered in my medical decision making (see chart for details).    MDM Rules/Calculators/A&P                         Kona Hollimon is a 70 y.o. female medical history of diabetes, hypertension, hypothyroidism, stage III kidney disease that presents the emerge department today for lower abdominal pain for the past week.   Patient with critical values of creatinine of 6.42, patient baseline around 1.5.  This is most likely from dehydration, patient with acute renal injury.  Will obtain repeat BMP at this time, VBG and Foley ordered.  Fluids and calcium initiated.  CT renal with 7 mm obstructing stone in the left ureter, will speak to urology at this time.  Repeat BMP with creatinine of 6.51.  Patient does also have urinalysis that does appear infected with WBC of 14.3.  Will initiate ceftriaxone.  Patient does not appear to be septic, no tachycardia or fever.  Patient is hemodynamically stable.  However will obtain lactic acid and blood cultures at this time.  Spoke to Dr. Alinda Money, urology who wants patient over at [facility] to operate emergently.  ED to ED transfer.  Spoke to Dr. Pollyann Savoy long ED provider who will accept the patient.    The patient appears reasonably stabilized for admission considering the current resources, flow, and capabilities available in the ED at this time, and I doubt any other [facility] requiring further screening and/or treatment in the ED prior to admission.  I discussed this case with my attending physician who cosigned this note including patient's presenting symptoms, physical exam, and planned diagnostics and interventions. Attending physician stated agreement with plan or made changes to plan which were implemented.   Attending physician assessed patient at bedside.  Final Clinical Impression(s) / ED Diagnoses Final diagnoses:  Acute renal injury [facility])  Nephrolithiasis    Rx / DC Orders ED Discharge Orders    None       Alfredia Client, PA-C 01/11/20 0020    Quintella Reichert, MD 01/12/20 [phone]

## 2020-01-10 NOTE — ED Notes (Signed)
Paged Dr Pamala Hurry (Urology) @ [phone]

## 2020-01-10 NOTE — Consult Note (Signed)
. Urology Consult   Physician requesting consult: Dr. Eudelia Bunch  Reason for consult: Left ureteral stone and acute kidney injury  History of Present Illness: Margaret [name] is a 70 y.o. COVID NEGATIVE female with a three day history of nausea, anorexia, anuria and left sided flank pain along with general malaise.  This caused her to present to the [facility] ER tonight.  CT imaging revealed a 7 mm proximal left ureteral stone and her labs demonstrated AKI with a Cr of 6.51.  She was also noted have evidence of a UTI on her UA but denies dysuria, frequency, urgency, etc.    She has baseline CKD with a Cr of about 1.3.  She denies a prior history of urolithiasis.    Past Medical History:  Diagnosis Date  . Diabetes mellitus without complication (HCC)   . Hypertension   . Hypothyroid   . Renal disorder     History reviewed. No pertinent surgical history.   Current Hospital Medications:  Home meds:  No current facility-administered medications on file prior to encounter.   Current Outpatient Medications on File Prior to Encounter  Medication Sig Dispense Refill  . amLODipine (NORVASC) 10 MG tablet Take 1 tablet (10 mg total) by mouth daily. For HTN 30 tablet 0  . atorvastatin (LIPITOR) 20 MG tablet Take 1 tablet (20 mg total) by mouth daily. 30 tablet 0  . calcitRIOL (ROCALTROL) 0.5 MCG capsule Take 1 capsule (0.5 mcg total) by mouth 2 (two) times daily. FOR HYPOCALCEMIA 60 capsule 0  . calcium-vitamin D (OSCAL WITH D) 500-200 MG-UNIT tablet Take 1 tablet by mouth 2 (two) times daily. 60 tablet 0  . carvedilol (COREG) 25 MG tablet Take 1 tablet (25 mg total) by mouth 2 (two) times daily with a meal. 60 tablet 0  . ferrous sulfate 325 (65 FE) MG tablet Take 325 mg by mouth daily with breakfast. For Anemia    . furosemide (LASIX) 20 MG tablet Take 1 tablet (20 mg total) by mouth daily as needed (For Lower Leg Swelling). 15 tablet 0  . hydrALAZINE (APRESOLINE) 100 MG tablet Take 1  tablet (100 mg total) by mouth 3 (three) times daily. 90 tablet 0  . levothyroxine (SYNTHROID) 175 MCG tablet Take 1 tablet (175 mcg total) by mouth daily before breakfast. 30 tablet 0  . lisinopril (ZESTRIL) 40 MG tablet Take 1 tablet (40 mg total) by mouth daily. 30 tablet 0  . NON FORMULARY DIET: REGULAR, NAS/CCD    . vitamin B-12 (CYANOCOBALAMIN) 1000 MCG tablet Take 1 tablet (1,000 mcg total) by mouth daily. 30 tablet 0  . aspirin 81 MG chewable tablet Chew by mouth 2 (two) times daily.    . bisacodyl (DULCOLAX) 10 MG suppository Place 10 mg rectally daily as needed for moderate constipation. FOR UP TO 10 DAYS (IF NO STOOL IN 24 HOURS WHILE RECIEVING OPIODS)    . calcium carbonate (TUMS - DOSED IN MG ELEMENTAL CALCIUM) 500 MG chewable tablet Chew 1 tablet by mouth 3 (three) times daily with meals.       Scheduled Meds: . fentaNYL (SUBLIMAZE) injection  50 mcg Intravenous Once   Continuous Infusions: . cefTRIAXone (ROCEPHIN)  IV 1 g (01/10/20 2150)   PRN Meds:.  Allergies:  Allergies  Allergen Reactions  . Latex Itching  . Nsaids Other (See Comments)    Contraindicated due to Kidney Disease  . Penicillins Rash    rash    History reviewed. No pertinent family history.  Social  History:  reports that she has never smoked. She has never used smokeless tobacco. She reports current alcohol use. She reports that she does not use drugs.  ROS: A complete review of systems was performed.  All systems are negative except for pertinent findings as noted.  Physical Exam:  Vital signs in last 24 hours: Temp:  [98.2 F (36.8 C)] 98.2 F (36.8 C) (01/05 1641) Pulse Rate:  [80-89] 89 (01/05 2130) Resp:  [16-33] 21 (01/05 2130) BP: (116-173)/(65-95) 168/95 (01/05 2130) SpO2:  [94 %-100 %] 99 % (01/05 2130) Weight:  [98.9 kg] 98.9 kg (01/05 1642) Constitutional:  Alert and oriented, No acute distress Cardiovascular: Regular rate and rhythm, No JVD Respiratory: Normal respiratory  effort, Lungs clear bilaterally GI: Abdomen is soft, with severe tenderness of LUQ GU: Severe left CVA tenderness, Foley in place with grossly clear urine Lymphatic: No lymphadenopathy Neurologic: Grossly intact, no focal deficits Psychiatric: Normal mood and affect  Laboratory Data:  Recent Labs    01/10/20 1651 01/10/20 1859  WBC 14.3*  --   HGB 13.2 13.9  HCT 39.2 41.0  PLT 177  --     Recent Labs    01/10/20 1651 01/10/20 1850 01/10/20 1859  NA 132* 133* 132*  K 2.9* 3.3* 3.2*  CL 90* 90*  --   GLUCOSE 108* 107*  --   BUN 116* 115*  --   CALCIUM 5.8* 5.8*  --   CREATININE 6.42* 6.51*  --      Results for orders placed or performed during the hospital encounter of 01/10/20 (from the past 24 hour(s))  Lipase, blood     Status: None   Collection Time: 01/10/20  4:51 PM  Result Value Ref Range   Lipase 19 11 - 51 U/L  Comprehensive metabolic panel     Status: Abnormal   Collection Time: 01/10/20  4:51 PM  Result Value Ref Range   Sodium 132 (L) 135 - 145 mmol/L   Potassium 2.9 (L) 3.5 - 5.1 mmol/L   Chloride 90 (L) 98 - 111 mmol/L   CO2 18 (L) 22 - 32 mmol/L   Glucose, Bld 108 (H) 70 - 99 mg/dL   BUN 116 (H) 8 - 23 mg/dL   Creatinine, Ser 6.42 (H) 0.44 - 1.00 mg/dL   Calcium 5.8 (LL) 8.9 - 10.3 mg/dL   Total Protein 7.7 6.5 - 8.1 g/dL   Albumin 3.2 (L) 3.5 - 5.0 g/dL   AST 52 (H) 15 - 41 U/L   ALT 42 0 - 44 U/L   Alkaline Phosphatase 97 38 - 126 U/L   Total Bilirubin 0.7 0.3 - 1.2 mg/dL   GFR, Estimated 7 (L) >60 mL/min   Anion gap 24 (H) 5 - 15  CBC     Status: Abnormal   Collection Time: 01/10/20  4:51 PM  Result Value Ref Range   WBC 14.3 (H) 4.0 - 10.5 K/uL   RBC 4.81 3.87 - 5.11 MIL/uL   Hemoglobin 13.2 12.0 - 15.0 g/dL   HCT 39.2 36.0 - 46.0 %   MCV 81.5 80.0 - 100.0 fL   MCH 27.4 26.0 - 34.0 pg   MCHC 33.7 30.0 - 36.0 g/dL   RDW 16.0 (H) 11.5 - 15.5 %   Platelets 177 150 - 400 K/uL   nRBC 0.0 0.0 - 0.2 %  Basic metabolic panel     Status:  Abnormal   Collection Time: 01/10/20  6:50 PM  Result Value Ref Range  Sodium 133 (L) 135 - 145 mmol/L   Potassium 3.3 (L) 3.5 - 5.1 mmol/L   Chloride 90 (L) 98 - 111 mmol/L   CO2 20 (L) 22 - 32 mmol/L   Glucose, Bld 107 (H) 70 - 99 mg/dL   BUN 115 (H) 8 - 23 mg/dL   Creatinine, Ser 6.51 (H) 0.44 - 1.00 mg/dL   Calcium 5.8 (LL) 8.9 - 10.3 mg/dL   GFR, Estimated 6 (L) >60 mL/min   Anion gap 23 (H) 5 - 15  Lactic acid, plasma     Status: None   Collection Time: 01/10/20  6:50 PM  Result Value Ref Range   Lactic Acid, Venous 1.9 0.5 - 1.9 mmol/L  I-Stat venous blood gas, ([place] [facility])     Status: Abnormal   Collection Time: 01/10/20  6:59 PM  Result Value Ref Range   pH, Ven 7.317 7.250 - 7.430   pCO2, Ven 42.7 (L) 44.0 - 60.0 mmHg   pO2, Ven 31.0 (LL) 32.0 - 45.0 mmHg   Bicarbonate 21.9 20.0 - 28.0 mmol/L   TCO2 23 22 - 32 mmol/L   O2 Saturation 56.0 %   Acid-base deficit 4.0 (H) 0.0 - 2.0 mmol/L   Sodium 132 (L) 135 - 145 mmol/L   Potassium 3.2 (L) 3.5 - 5.1 mmol/L   Calcium, Ion 0.67 (LL) 1.15 - 1.40 mmol/L   HCT 41.0 36.0 - 46.0 %   Hemoglobin 13.9 12.0 - 15.0 g/dL   Patient temperature 98.2 F    Sample type VENOUS    Comment NOTIFIED PHYSICIAN   Urinalysis, Routine w reflex microscopic Urine, Catheterized     Status: Abnormal   Collection Time: 01/10/20  7:09 PM  Result Value Ref Range   Color, Urine YELLOW YELLOW   APPearance CLOUDY (A) CLEAR   Specific Gravity, Urine 1.010 1.005 - 1.030   pH 6.5 5.0 - 8.0   Glucose, UA NEGATIVE NEGATIVE mg/dL   Hgb urine dipstick MODERATE (A) NEGATIVE   Bilirubin Urine NEGATIVE NEGATIVE   Ketones, ur NEGATIVE NEGATIVE mg/dL   Protein, ur 30 (A) NEGATIVE mg/dL   Nitrite NEGATIVE NEGATIVE   Leukocytes,Ua LARGE (A) NEGATIVE  Urinalysis, Microscopic (reflex)     Status: Abnormal   Collection Time: 01/10/20  7:09 PM  Result Value Ref Range   RBC / HPF 0-5 0 - 5 RBC/hpf   WBC, UA >50 0 - 5 WBC/hpf   Bacteria, UA MANY (A) NONE SEEN    Squamous Epithelial / LPF 11-20 0 - 5  Lactic acid, plasma     Status: None   Collection Time: 01/10/20  8:49 PM  Result Value Ref Range   Lactic Acid, Venous 1.4 0.5 - 1.9 mmol/L   No results found for this or any previous visit (from the past 240 hour(s)).  Renal Function: Recent Labs    01/10/20 1651 01/10/20 1850  CREATININE 6.42* 6.51*   Estimated Creatinine Clearance: 8.8 mL/min (A) (by C-G formula based on SCr of 6.51 mg/dL (H)).  Radiologic Imaging: CT Abdomen Pelvis Wo Contrast  Result Date: 01/10/2020 CLINICAL DATA:  Lower abdominal pain. EXAM: CT ABDOMEN AND PELVIS WITHOUT CONTRAST TECHNIQUE: Multidetector CT imaging of the abdomen and pelvis was performed following the standard protocol without IV contrast. COMPARISON:  September 20, 2016 FINDINGS: Lower chest: Very mild atelectasis is seen within the bilateral lung bases. Hepatobiliary: No focal liver abnormality is seen. No gallstones, gallbladder wall thickening, or biliary dilatation. Pancreas: Unremarkable. No pancreatic ductal dilatation or  surrounding inflammatory changes. Spleen: Normal in size without focal abnormality. Adrenals/Urinary Tract: Adrenal glands are unremarkable. Kidneys are normal in size. A 2.4 cm x 1.7 cm well-defined, exophytic area of low attenuation is seen within the anteromedial aspect of the mid to upper right kidney. This area is decreased in size when compared to the prior study. A stable 2.6 cm x 1.9 cm, exophytic, well-defined mildly hyperdense area (approximately 39.78 Hounsfield units) is seen along the posterior aspect of the mid left kidney. 3 mm calcifications are seen within the medial aspect of the mid left kidney. Very mild diffuse calcification of the renal pyramids is also noted on the left. A 7 mm obstructing renal stone is noted within the proximal left ureter with moderate severity left-sided hydronephrosis and hydroureter. A Foley catheter is seen within an empty urinary bladder.  Stomach/Bowel: There is a small hiatal hernia. Appendix appears normal. No evidence of bowel dilatation. Noninflamed diverticula are seen within the distal descending and sigmoid colon. Vascular/Lymphatic: No significant vascular findings are present. No enlarged abdominal or pelvic lymph nodes. Reproductive: The uterus is not clearly identified. This area is limited in evaluation secondary to the presence of overlying streak artifact. Other: A 2.6 cm x 1.7 cm fat containing umbilical hernia is seen. There is no evidence of abdominal or pelvic free fluid. Musculoskeletal: Bilateral total hip replacements are seen with an extensive amount of associated streak artifact. Subsequently limited evaluation of the adjacent osseous and soft tissue structures is noted. IMPRESSION: 1. 7 mm obstructing renal stone within the proximal left ureter. 2. Findings likely consistent with complex bilateral renal cysts. This corresponds to the findings seen on the prior abdomen and pelvis CT and prior renal ultrasound. A hemorrhagic component involving the lesion within the left kidney cannot be excluded. MRI correlation is recommended to further characterize these areas. 3. Colonic diverticulosis. 4. Small hiatal hernia. 5. Fat-containing umbilical hernia. 6. Bilateral total hip replacements. Electronically Signed   By: Virgina Norfolk M.D.   On: 01/10/2020 20:06    I independently reviewed the above imaging studies.  Impression/Recommendation:  1) Left ureteral stone / AKI / UTI: She is to be admitted per the hospitalist service. She will undergo cystoscopy and left ureteral stent placement to relieve her obstruction.  Although her AKI is likely multifactorial, unilateral renal obstruction is certainly likely at least contributory and she will benefit from relief of obstruction for this reason as well as pain control and in the setting of infection. I discussed the potential benefits and risks of the procedure, side effects  of the proposed treatment, the likelihood of the patient achieving the goals of the procedure, and any potential problems that might occur during the procedure or recuperation. Should her renal function not improve with stent placement and hydration, nephrology consultation can be obtained subsequently.  2) UTI: Continue broad spectrum IV antibiotics pending culture results.  She received ceftriaxone earlier tonight.  Cultures pending.  3) Complex renal cyst:  Will need follow up MRI of the abdomen with and without IV contrast after her acute medical issues are resolved for further evaluation.  Dutch Gray 01/10/2020, 9:53 PM  Pryor Curia. MD   CC: Dr. Leonette Monarch

## 2020-01-11 ENCOUNTER — Encounter (HOSPITAL_COMMUNITY)
Admission: EM | Disposition: A | Discharge status: Home / Self Care | Payer: Self-pay | Source: Home / Self Care | Attending: Internal Medicine

## 2020-01-11 ENCOUNTER — Encounter (HOSPITAL_COMMUNITY): Payer: Self-pay | Admitting: Family Medicine

## 2020-01-11 ENCOUNTER — Inpatient Hospital Stay (HOSPITAL_COMMUNITY): Payer: Medicare PPO | Admitting: Anesthesiology

## 2020-01-11 ENCOUNTER — Inpatient Hospital Stay (HOSPITAL_COMMUNITY): Payer: Medicare PPO

## 2020-01-11 DIAGNOSIS — N1831 Chronic kidney disease, stage 3a: Secondary | ICD-10-CM | POA: Insufficient documentation

## 2020-01-11 DIAGNOSIS — Z6841 Body Mass Index (BMI) 40.0 and over, adult: Secondary | ICD-10-CM | POA: Diagnosis not present

## 2020-01-11 DIAGNOSIS — N179 Acute kidney failure, unspecified: Secondary | ICD-10-CM | POA: Diagnosis present

## 2020-01-11 DIAGNOSIS — B964 Proteus (mirabilis) (morganii) as the cause of diseases classified elsewhere: Secondary | ICD-10-CM | POA: Diagnosis present

## 2020-01-11 DIAGNOSIS — E872 Acidosis: Secondary | ICD-10-CM

## 2020-01-11 DIAGNOSIS — E890 Postprocedural hypothyroidism: Secondary | ICD-10-CM | POA: Diagnosis present

## 2020-01-11 DIAGNOSIS — E782 Mixed hyperlipidemia: Secondary | ICD-10-CM | POA: Diagnosis present

## 2020-01-11 DIAGNOSIS — Z7982 Long term (current) use of aspirin: Secondary | ICD-10-CM | POA: Diagnosis not present

## 2020-01-11 DIAGNOSIS — E892 Postprocedural hypoparathyroidism: Secondary | ICD-10-CM | POA: Diagnosis present

## 2020-01-11 DIAGNOSIS — E8729 Other acidosis: Secondary | ICD-10-CM

## 2020-01-11 DIAGNOSIS — B9689 Other specified bacterial agents as the cause of diseases classified elsewhere: Secondary | ICD-10-CM | POA: Diagnosis present

## 2020-01-11 DIAGNOSIS — E876 Hypokalemia: Secondary | ICD-10-CM | POA: Diagnosis present

## 2020-01-11 DIAGNOSIS — R9431 Abnormal electrocardiogram [ECG] [EKG]: Secondary | ICD-10-CM | POA: Insufficient documentation

## 2020-01-11 DIAGNOSIS — Z793 Long term (current) use of hormonal contraceptives: Secondary | ICD-10-CM | POA: Diagnosis not present

## 2020-01-11 DIAGNOSIS — Z96641 Presence of right artificial hip joint: Secondary | ICD-10-CM | POA: Diagnosis present

## 2020-01-11 DIAGNOSIS — N281 Cyst of kidney, acquired: Secondary | ICD-10-CM | POA: Diagnosis present

## 2020-01-11 DIAGNOSIS — R7881 Bacteremia: Secondary | ICD-10-CM | POA: Diagnosis present

## 2020-01-11 DIAGNOSIS — Z20822 Contact with and (suspected) exposure to covid-19: Secondary | ICD-10-CM | POA: Diagnosis present

## 2020-01-11 DIAGNOSIS — I129 Hypertensive chronic kidney disease with stage 1 through stage 4 chronic kidney disease, or unspecified chronic kidney disease: Secondary | ICD-10-CM | POA: Diagnosis present

## 2020-01-11 DIAGNOSIS — N390 Urinary tract infection, site not specified: Secondary | ICD-10-CM

## 2020-01-11 DIAGNOSIS — N202 Calculus of kidney with calculus of ureter: Secondary | ICD-10-CM | POA: Diagnosis present

## 2020-01-11 DIAGNOSIS — Z79899 Other long term (current) drug therapy: Secondary | ICD-10-CM | POA: Diagnosis not present

## 2020-01-11 DIAGNOSIS — E1122 Type 2 diabetes mellitus with diabetic chronic kidney disease: Secondary | ICD-10-CM | POA: Diagnosis present

## 2020-01-11 DIAGNOSIS — E860 Dehydration: Secondary | ICD-10-CM | POA: Diagnosis present

## 2020-01-11 DIAGNOSIS — N136 Pyonephrosis: Secondary | ICD-10-CM | POA: Diagnosis present

## 2020-01-11 DIAGNOSIS — N201 Calculus of ureter: Secondary | ICD-10-CM | POA: Insufficient documentation

## 2020-01-11 DIAGNOSIS — N1832 Chronic kidney disease, stage 3b: Secondary | ICD-10-CM | POA: Diagnosis present

## 2020-01-11 HISTORY — PX: CYSTOSCOPY WITH URETEROSCOPY AND STENT PLACEMENT: SHX6377

## 2020-01-11 LAB — BLOOD CULTURE ID PANEL (REFLEXED) - BCID2

## 2020-01-11 LAB — COMPREHENSIVE METABOLIC PANEL
ALT: 31 U/L (ref 0–44)
AST: 31 U/L (ref 15–41)
Albumin: 2.6 g/dL — ABNORMAL LOW (ref 3.5–5.0)
Alkaline Phosphatase: 86 U/L (ref 38–126)
Anion gap: 18 — ABNORMAL HIGH (ref 5–15)
BUN: 117 mg/dL — ABNORMAL HIGH (ref 8–23)
CO2: 18 mmol/L — ABNORMAL LOW (ref 22–32)
Calcium: 5.2 mg/dL — CL (ref 8.9–10.3)
Chloride: 95 mmol/L — ABNORMAL LOW (ref 98–111)
Creatinine, Ser: 5.58 mg/dL — ABNORMAL HIGH (ref 0.44–1.00)
GFR, Estimated: 8 mL/min — ABNORMAL LOW (ref >60)
Glucose, Bld: 114 mg/dL — ABNORMAL HIGH (ref 70–99)
Potassium: 3.4 mmol/L — ABNORMAL LOW (ref 3.5–5.1)
Sodium: 131 mmol/L — ABNORMAL LOW (ref 135–145)
Total Bilirubin: 0.8 mg/dL (ref 0.3–1.2)
Total Protein: 6 g/dL — ABNORMAL LOW (ref 6.5–8.1)

## 2020-01-11 LAB — CBC
HCT: 30.5 % — ABNORMAL LOW (ref 36.0–46.0)
Hemoglobin: 10.2 g/dL — ABNORMAL LOW (ref 12.0–15.0)
MCH: 27.3 pg (ref 26.0–34.0)
MCHC: 33.4 g/dL (ref 30.0–36.0)
MCV: 81.6 fL (ref 80.0–100.0)
Platelets: 144 10*3/uL — ABNORMAL LOW (ref 150–400)
RBC: 3.74 MIL/uL — ABNORMAL LOW (ref 3.87–5.11)
RDW: 16.2 % — ABNORMAL HIGH (ref 11.5–15.5)
WBC: 13.8 10*3/uL — ABNORMAL HIGH (ref 4.0–10.5)
nRBC: 0 % (ref 0.0–0.2)

## 2020-01-11 LAB — HIV ANTIBODY (ROUTINE TESTING W REFLEX): HIV Screen 4th Generation wRfx: NONREACTIVE

## 2020-01-11 LAB — GLUCOSE, CAPILLARY
Glucose-Capillary: 78 mg/dL (ref 70–99)
Glucose-Capillary: 83 mg/dL (ref 70–99)

## 2020-01-11 LAB — MAGNESIUM: Magnesium: 2.2 mg/dL (ref 1.7–2.4)

## 2020-01-11 SURGERY — CYSTOURETEROSCOPY, WITH STENT INSERTION
Anesthesia: General | Site: Ureter | Laterality: Left

## 2020-01-11 MED ORDER — FENTANYL CITRATE (PF) 100 MCG/2ML IJ SOLN
INTRAMUSCULAR | Status: AC
  Filled 2020-01-11: qty 2

## 2020-01-11 MED ORDER — ACETAMINOPHEN 650 MG RE SUPP
650.0000 mg | Freq: Four times a day (QID) | RECTAL | Status: DC | PRN
  Filled 2020-01-11: qty 1

## 2020-01-11 MED ORDER — ASPIRIN 81 MG PO CHEW
81.0000 mg | CHEWABLE_TABLET | Freq: Every day | ORAL | Status: DC
  Administered 2020-01-11 – 2020-01-15 (×5): 81 mg via ORAL
  Filled 2020-01-11 (×6): qty 1

## 2020-01-11 MED ORDER — SODIUM CHLORIDE 0.9 % IR SOLN
Status: DC | PRN
  Administered 2020-01-11: 3000 mL

## 2020-01-11 MED ORDER — MIDAZOLAM HCL 5 MG/5ML IJ SOLN
INTRAMUSCULAR | Status: DC | PRN
  Administered 2020-01-11: 2 mg via INTRAVENOUS

## 2020-01-11 MED ORDER — ATORVASTATIN CALCIUM 20 MG PO TABS
20.0000 mg | ORAL_TABLET | Freq: Every day | ORAL | Status: DC
  Administered 2020-01-11 – 2020-01-16 (×6): 20 mg via ORAL
  Filled 2020-01-11 (×6): qty 1

## 2020-01-11 MED ORDER — LACTATED RINGERS IV SOLN: INTRAVENOUS | Status: DC | PRN

## 2020-01-11 MED ORDER — HEPARIN SODIUM (PORCINE) 5000 UNIT/ML IJ SOLN
5000.0000 [IU] | Freq: Three times a day (TID) | INTRAMUSCULAR | Status: DC
  Administered 2020-01-11 – 2020-01-16 (×14): 5000 [IU] via SUBCUTANEOUS
  Filled 2020-01-11 (×15): qty 1

## 2020-01-11 MED ORDER — CALCIUM GLUCONATE-NACL 2-0.675 GM/100ML-% IV SOLN
2.0000 g | Freq: Once | INTRAVENOUS | Status: AC
  Administered 2020-01-11: 2000 mg via INTRAVENOUS
  Filled 2020-01-11: qty 100

## 2020-01-11 MED ORDER — LIDOCAINE 2% (20 MG/ML) 5 ML SYRINGE
INTRAMUSCULAR | Status: DC | PRN
  Administered 2020-01-11: 80 mg via INTRAVENOUS

## 2020-01-11 MED ORDER — SODIUM CHLORIDE 0.9 % IV SOLN
2.0000 g | INTRAVENOUS | Status: DC
  Administered 2020-01-11 – 2020-01-12 (×2): 2 g via INTRAVENOUS
  Filled 2020-01-11 (×2): qty 2

## 2020-01-11 MED ORDER — CALCIUM CARBONATE-VITAMIN D 500-200 MG-UNIT PO TABS
1.0000 | ORAL_TABLET | Freq: Two times a day (BID) | ORAL | Status: DC
  Administered 2020-01-11 – 2020-01-16 (×11): 1 via ORAL
  Filled 2020-01-11 (×13): qty 1

## 2020-01-11 MED ORDER — SODIUM CHLORIDE 0.9 % IR SOLN
Status: DC | PRN
  Administered 2020-01-11: 1000 mL

## 2020-01-11 MED ORDER — PHENYLEPHRINE 40 MCG/ML (10ML) SYRINGE FOR IV PUSH (FOR BLOOD PRESSURE SUPPORT)
PREFILLED_SYRINGE | INTRAVENOUS | Status: DC | PRN
  Administered 2020-01-11: 80 ug via INTRAVENOUS

## 2020-01-11 MED ORDER — PROPOFOL 10 MG/ML IV BOLUS
INTRAVENOUS | Status: DC | PRN
  Administered 2020-01-11: 180 mg via INTRAVENOUS

## 2020-01-11 MED ORDER — LEVOTHYROXINE SODIUM 25 MCG PO TABS
175.0000 ug | ORAL_TABLET | Freq: Every day | ORAL | Status: DC
  Administered 2020-01-12 – 2020-01-16 (×5): 175 ug via ORAL
  Filled 2020-01-11: qty 2
  Filled 2020-01-11 (×3): qty 1
  Filled 2020-01-11: qty 2
  Filled 2020-01-11: qty 1
  Filled 2020-01-11: qty 2
  Filled 2020-01-11: qty 1
  Filled 2020-01-11: qty 2

## 2020-01-11 MED ORDER — SODIUM CHLORIDE 0.9 % IV SOLN: 1.0000 g | INTRAVENOUS | Status: DC

## 2020-01-11 MED ORDER — CALCITRIOL 0.5 MCG PO CAPS
0.5000 ug | ORAL_CAPSULE | Freq: Two times a day (BID) | ORAL | Status: DC
  Administered 2020-01-11 – 2020-01-14 (×8): 0.5 ug via ORAL
  Filled 2020-01-11 (×9): qty 1

## 2020-01-11 MED ORDER — HYDRALAZINE HCL 50 MG PO TABS
100.0000 mg | ORAL_TABLET | Freq: Three times a day (TID) | ORAL | Status: DC
  Administered 2020-01-11: 100 mg via ORAL
  Filled 2020-01-11 (×2): qty 2

## 2020-01-11 MED ORDER — POTASSIUM CHLORIDE 10 MEQ/100ML IV SOLN
10.0000 meq | INTRAVENOUS | Status: AC
  Administered 2020-01-11 (×2): 10 meq via INTRAVENOUS
  Filled 2020-01-11 (×2): qty 100

## 2020-01-11 MED ORDER — AMLODIPINE BESYLATE 10 MG PO TABS
10.0000 mg | ORAL_TABLET | Freq: Every day | ORAL | Status: DC
  Administered 2020-01-11: 10 mg via ORAL
  Filled 2020-01-11: qty 1

## 2020-01-11 MED ORDER — CARVEDILOL 25 MG PO TABS
25.0000 mg | ORAL_TABLET | Freq: Two times a day (BID) | ORAL | Status: DC
  Administered 2020-01-11: 25 mg via ORAL
  Filled 2020-01-11: qty 1

## 2020-01-11 MED ORDER — FENTANYL CITRATE (PF) 100 MCG/2ML IJ SOLN
INTRAMUSCULAR | Status: DC | PRN
  Administered 2020-01-11: 50 ug via INTRAVENOUS

## 2020-01-11 MED ORDER — FENTANYL CITRATE (PF) 100 MCG/2ML IJ SOLN: 25.0000 ug | INTRAMUSCULAR | Status: DC | PRN

## 2020-01-11 MED ORDER — LACTATED RINGERS IV SOLN: INTRAVENOUS | Status: DC

## 2020-01-11 MED ORDER — CHLORHEXIDINE GLUCONATE CLOTH 2 % EX PADS
6.0000 | MEDICATED_PAD | Freq: Every day | CUTANEOUS | Status: DC
  Administered 2020-01-11 – 2020-01-15 (×5): 6 via TOPICAL

## 2020-01-11 MED ORDER — IOHEXOL 300 MG/ML  SOLN
INTRAMUSCULAR | Status: DC | PRN
  Administered 2020-01-11: 3 mL via URETHRAL

## 2020-01-11 MED ORDER — MIDAZOLAM HCL 2 MG/2ML IJ SOLN
INTRAMUSCULAR | Status: AC
  Filled 2020-01-11: qty 2

## 2020-01-11 MED ORDER — ACETAMINOPHEN 325 MG PO TABS
650.0000 mg | ORAL_TABLET | Freq: Four times a day (QID) | ORAL | Status: DC | PRN
  Administered 2020-01-11 – 2020-01-15 (×3): 650 mg via ORAL
  Filled 2020-01-11 (×4): qty 2

## 2020-01-11 SURGICAL SUPPLY — 22 items
BAG URO CATCHER STRL LF (MISCELLANEOUS) ×2 IMPLANT
BASKET ZERO TIP NITINOL 2.4FR (BASKET) IMPLANT
CATH INTERMIT  6FR 70CM (CATHETERS) IMPLANT
CLOTH BEACON ORANGE TIMEOUT ST (SAFETY) ×2 IMPLANT
GLOVE BIOGEL PI IND STRL 7.5 (GLOVE) ×1 IMPLANT
GLOVE BIOGEL PI IND STRL 8 (GLOVE) ×1 IMPLANT
GLOVE BIOGEL PI INDICATOR 7.5 (GLOVE) ×1
GLOVE BIOGEL PI INDICATOR 8 (GLOVE) ×1
GLOVE SURG ENC TEXT LTX SZ7.5 (GLOVE) ×2 IMPLANT
GOWN STRL REUS W/ TWL LRG LVL3 (GOWN DISPOSABLE) ×1 IMPLANT
GOWN STRL REUS W/ TWL XL LVL3 (GOWN DISPOSABLE) ×1 IMPLANT
GOWN STRL REUS W/TWL LRG LVL3 (GOWN DISPOSABLE) ×3 IMPLANT
GOWN STRL REUS W/TWL XL LVL3 (GOWN DISPOSABLE) ×1
GUIDEWIRE STR DUAL SENSOR (WIRE) ×2 IMPLANT
GUIDEWIRE ZIPWRE .038 STRAIGHT (WIRE) IMPLANT
KIT TURNOVER KIT A (KITS) ×2 IMPLANT
MANIFOLD NEPTUNE II (INSTRUMENTS) ×2 IMPLANT
PACK CYSTO (CUSTOM PROCEDURE TRAY) ×2 IMPLANT
SHEATH URETERAL 12FRX35CM (MISCELLANEOUS) IMPLANT
STENT URET 6FRX24 CONTOUR (STENTS) ×2 IMPLANT
TUBING CONNECTING 10 (TUBING) ×2 IMPLANT
TUBING UROLOGY SET (TUBING) ×2 IMPLANT

## 2020-01-11 NOTE — Progress Notes (Addendum)
PHARMACY - PHYSICIAN COMMUNICATION CRITICAL VALUE ALERT - BLOOD CULTURE IDENTIFICATION (BCID)  Margaret [name] is an 70 y.o. female who presented to [facility] on 01/10/2020 with [name] chief complaint of abdominal/flank pain, n/v.  Assessment:  1/4 BCx bottles growing Proteus (likely urinary source given presentation); no resistance detected  Name of physician (or Provider) Contacted: Margaret [name]  Current antibiotics: Rocephin adjusted to 2g q24  Changes to prescribed antibiotics recommended: Patient is on recommended antibiotics - No changes needed  Results for orders placed or performed during the hospital encounter of 01/10/20  Blood Culture ID Panel (Reflexed) (Collected: 01/10/2020  8:45 PM)  Result Value Ref Range   Enterococcus faecalis NOT DETECTED NOT DETECTED   Enterococcus Faecium NOT DETECTED NOT DETECTED   Listeria monocytogenes NOT DETECTED NOT DETECTED   Staphylococcus species NOT DETECTED NOT DETECTED   Staphylococcus aureus (BCID) NOT DETECTED NOT DETECTED   Staphylococcus epidermidis NOT DETECTED NOT DETECTED   Staphylococcus lugdunensis NOT DETECTED NOT DETECTED   Streptococcus species NOT DETECTED NOT DETECTED   Streptococcus agalactiae NOT DETECTED NOT DETECTED   Streptococcus pneumoniae NOT DETECTED NOT DETECTED   Streptococcus pyogenes NOT DETECTED NOT DETECTED   [name].calcoaceticus-baumannii NOT DETECTED NOT DETECTED   Bacteroides fragilis NOT DETECTED NOT DETECTED   Enterobacterales DETECTED ([name]) NOT DETECTED   Enterobacter cloacae complex NOT DETECTED NOT DETECTED   Escherichia coli NOT DETECTED NOT DETECTED   Klebsiella aerogenes NOT DETECTED NOT DETECTED   Klebsiella oxytoca NOT DETECTED NOT DETECTED   Klebsiella pneumoniae NOT DETECTED NOT DETECTED   Proteus species DETECTED ([name]) NOT DETECTED   Salmonella species NOT DETECTED NOT DETECTED   Serratia marcescens NOT DETECTED NOT DETECTED   Haemophilus influenzae NOT DETECTED NOT DETECTED   Neisseria meningitidis NOT  DETECTED NOT DETECTED   Pseudomonas aeruginosa NOT DETECTED NOT DETECTED   Stenotrophomonas maltophilia NOT DETECTED NOT DETECTED   Candida albicans NOT DETECTED NOT DETECTED   Candida auris NOT DETECTED NOT DETECTED   Candida glabrata NOT DETECTED NOT DETECTED   Candida krusei NOT DETECTED NOT DETECTED   Candida parapsilosis NOT DETECTED NOT DETECTED   Candida tropicalis NOT DETECTED NOT DETECTED   Cryptococcus neoformans/gattii NOT DETECTED NOT DETECTED   CTX-M ESBL NOT DETECTED NOT DETECTED   Carbapenem resistance IMP NOT DETECTED NOT DETECTED   Carbapenem resistance KPC NOT DETECTED NOT DETECTED   Carbapenem resistance NDM NOT DETECTED NOT DETECTED   Carbapenem resist OXA 48 LIKE NOT DETECTED NOT DETECTED   Carbapenem resistance VIM NOT DETECTED NOT DETECTED    Margaret [name] 01/11/2020  2:37 PM

## 2020-01-11 NOTE — Progress Notes (Signed)
CRITICAL VALUE ALERT  Critical Value:  Ca+ 5.2  Date & Time Notied:  01/11/2020 - 10:38am  Provider Notified: Lewie Chamber MD  Orders Received/Actions taken: Awaiting response from MD.   NB. Shearon Stalls Charge Nurse notified.

## 2020-01-11 NOTE — Assessment & Plan Note (Addendum)
-   likely due to uremia from obstruction - starting to improve with relief of obstruction and IVF

## 2020-01-11 NOTE — Assessment & Plan Note (Addendum)
-  Due to obstructed ureteral stone on the left side -Urine culture has now speciated to Morganella, resistant to cefazolin (no other cephalosporin sensitivities) -Discontinued Rocephin and started meropenem on 1/8 - see bacteremia

## 2020-01-11 NOTE — Assessment & Plan Note (Signed)
-  Replete and recheck as needed 

## 2020-01-11 NOTE — Progress Notes (Signed)
PROGRESS NOTE    Margaret [name]   [id number]  DOB: [birth year]  DOA: 01/10/2020     0  PCP: Jonathon Bellows, PA-C  CC: abdominal pain, N/V  Hospital Course: [name] is a 70 yo female with PMH DMII, HTN, hypothyroidism, CKD 3 who presented to the ER with lower abdominal pain and left-sided flank pain. She had ongoing nausea and vomiting attributed to her pain as well. She has no known prior history of nephrolithiasis or renal dysfunction.  She underwent work-up in the ER including CT abdomen/pelvis which was notable for a 7 mm obstructing left ureteral stone. There were also complex bilateral renal cysts noted. Urinalysis was consistent with infection. She was started on Rocephin and urology was consulted. Chemistries were also consistent with renal failure due to outlet obstruction from ureterolithiasis.  She was taken to the OR shortly after admission and underwent cystoscopy and left ureteral stent placement.  After stent placement, renal function started to slowly improve as well as urine output.   Interval History:  Patient seen in her room this morning after returning from PACU.  She was very tired from having been up all night but did state that her abdominal pain felt improved and she was no longer significantly nauseous.  She was mostly wanting to sleep at this time.  Old records reviewed in assessment of this patient  ROS: Constitutional: negative for chills and fevers, Respiratory: negative for cough, Cardiovascular: negative for chest pain and Gastrointestinal: negative for abdominal pain  Assessment & Plan: * AKI (acute kidney injury) ([place]) -No known prior history of renal dysfunction. -Creatinine 6.42 on admission, BUN 116. CT abdomen/pelvis showed left ureterolithiasis with obstruction. Patient underwent urgent stent placement with urology on admission -Continue fluids -Continue strict intake and output -BMP daily  Complicated UTI (urinary tract  infection) -Due to obstructed ureteral stone on the left side -Continue Rocephin -Follow-up cultures. At risk for translocation to the blood, follow blood cultures  Ureterolithiasis - 7 mm stone obstructing left ureter on CT abd/pelvis on admission - s/p left ureteral stent by urology on admission; greatly appreciate assistance - outpatient follow up with urology   Metabolic acidosis, increased anion gap - likely due to uremia from obstruction - starting to improve with relief of obstruction - trend BMP - continue LR  Hypokalemia -Replete and recheck as needed  Hypocalcemia -Replete and recheck as needed    Antimicrobials: Rocephin 01/10/2020>> current  DVT prophylaxis: HSQ Code Status: Full Family Communication: none present Disposition Plan: Status is: Inpatient  Remains inpatient appropriate because:Ongoing diagnostic testing needed not appropriate for outpatient work up, IV treatments appropriate due to intensity of illness or inability to take PO and Inpatient level of care appropriate due to severity of illness   Dispo: The patient is from: Home              Anticipated d/c is to: Home              Anticipated d/c date is: 2 days              Patient currently is not medically stable to d/c.   Objective: Blood pressure (!) 103/55, pulse 82, temperature 98.7 F (37.1 C), temperature source Oral, resp. rate 20, height 5\' 1"  (1.549 m), weight 98.9 kg, SpO2 97 %.  Examination: General appearance: Very sleepy but no distress and appears comfortable Head: Normocephalic, without obvious abnormality, atraumatic Eyes: EOMI Lungs: clear to auscultation bilaterally Heart: regular rate and rhythm and  S1, S2 normal Abdomen: normal findings: bowel sounds normal and soft, non-tender Extremities: No edema Skin: mobility and turgor normal Neurologic: Grossly normal  Consultants:   Urology  Procedures:   Left ureteral stent placement, 01/11/2020  Data Reviewed: I have  personally reviewed following labs and imaging studies Results for orders placed or performed during the hospital encounter of 01/10/20 (from the past 24 hour(s))  Lipase, blood     Status: None   Collection Time: 01/10/20  4:51 PM  Result Value Ref Range   Lipase 19 11 - 51 U/L  Comprehensive metabolic panel     Status: Abnormal   Collection Time: 01/10/20  4:51 PM  Result Value Ref Range   Sodium 132 (L) 135 - 145 mmol/L   Potassium 2.9 (L) 3.5 - 5.1 mmol/L   Chloride 90 (L) 98 - 111 mmol/L   CO2 18 (L) 22 - 32 mmol/L   Glucose, Bld 108 (H) 70 - 99 mg/dL   BUN 161116 (H) 8 - 23 mg/dL   Creatinine, Ser 0.966.42 (H) 0.44 - 1.00 mg/dL   Calcium 5.8 (LL) 8.9 - 10.3 mg/dL   Total Protein 7.7 6.5 - 8.1 g/dL   Albumin 3.2 (L) 3.5 - 5.0 g/dL   AST 52 (H) 15 - 41 U/L   ALT 42 0 - 44 U/L   Alkaline Phosphatase 97 38 - 126 U/L   Total Bilirubin 0.7 0.3 - 1.2 mg/dL   GFR, Estimated 7 (L) >60 mL/min   Anion gap 24 (H) 5 - 15  CBC     Status: Abnormal   Collection Time: 01/10/20  4:51 PM  Result Value Ref Range   WBC 14.3 (H) 4.0 - 10.5 K/uL   RBC 4.81 3.87 - 5.11 MIL/uL   Hemoglobin 13.2 12.0 - 15.0 g/dL   HCT 04.539.2 40.936.0 - 81.146.0 %   MCV 81.5 80.0 - 100.0 fL   MCH 27.4 26.0 - 34.0 pg   MCHC 33.7 30.0 - 36.0 g/dL   RDW 91.416.0 (H) 78.211.5 - 95.615.5 %   Platelets 177 150 - 400 K/uL   nRBC 0.0 0.0 - 0.2 %  Resp Panel by RT-PCR (Flu A&B, Covid) Nasopharyngeal Swab     Status: None   Collection Time: 01/10/20  6:50 PM   Specimen: Nasopharyngeal Swab; Nasopharyngeal(NP) swabs in vial transport medium  Result Value Ref Range   SARS Coronavirus 2 by RT PCR NEGATIVE NEGATIVE   Influenza A by PCR NEGATIVE NEGATIVE   Influenza B by PCR NEGATIVE NEGATIVE  Basic metabolic panel     Status: Abnormal   Collection Time: 01/10/20  6:50 PM  Result Value Ref Range   Sodium 133 (L) 135 - 145 mmol/L   Potassium 3.3 (L) 3.5 - 5.1 mmol/L   Chloride 90 (L) 98 - 111 mmol/L   CO2 20 (L) 22 - 32 mmol/L   Glucose, Bld 107  (H) 70 - 99 mg/dL   BUN 213115 (H) 8 - 23 mg/dL   Creatinine, Ser 0.866.51 (H) 0.44 - 1.00 mg/dL   Calcium 5.8 (LL) 8.9 - 10.3 mg/dL   GFR, Estimated 6 (L) >60 mL/min   Anion gap 23 (H) 5 - 15  Lactic acid, plasma     Status: None   Collection Time: 01/10/20  6:50 PM  Result Value Ref Range   Lactic Acid, Venous 1.9 0.5 - 1.9 mmol/L  I-Stat venous blood gas, [facility])     Status: Abnormal   Collection Time: 01/10/20  6:59 PM  Result Value Ref Range   pH, Ven 7.317 7.250 - 7.430   pCO2, Ven 42.7 (L) 44.0 - 60.0 mmHg   pO2, Ven 31.0 (LL) 32.0 - 45.0 mmHg   Bicarbonate 21.9 20.0 - 28.0 mmol/L   TCO2 23 22 - 32 mmol/L   O2 Saturation 56.0 %   Acid-base deficit 4.0 (H) 0.0 - 2.0 mmol/L   Sodium 132 (L) 135 - 145 mmol/L   Potassium 3.2 (L) 3.5 - 5.1 mmol/L   Calcium, Ion 0.67 (LL) 1.15 - 1.40 mmol/L   HCT 41.0 36.0 - 46.0 %   Hemoglobin 13.9 12.0 - 15.0 g/dL   Patient temperature 16.198.2 F    Sample type VENOUS    Comment NOTIFIED PHYSICIAN   Urinalysis, Routine w reflex microscopic Urine, Catheterized     Status: Abnormal   Collection Time: 01/10/20  7:09 PM  Result Value Ref Range   Color, Urine YELLOW YELLOW   APPearance CLOUDY (A) CLEAR   Specific Gravity, Urine 1.010 1.005 - 1.030   pH 6.5 5.0 - 8.0   Glucose, UA NEGATIVE NEGATIVE mg/dL   Hgb urine dipstick MODERATE (A) NEGATIVE   Bilirubin Urine NEGATIVE NEGATIVE   Ketones, ur NEGATIVE NEGATIVE mg/dL   Protein, ur 30 (A) NEGATIVE mg/dL   Nitrite NEGATIVE NEGATIVE   Leukocytes,Ua LARGE (A) NEGATIVE  Urinalysis, Microscopic (reflex)     Status: Abnormal   Collection Time: 01/10/20  7:09 PM  Result Value Ref Range   RBC / HPF 0-5 0 - 5 RBC/hpf   WBC, UA >50 0 - 5 WBC/hpf   Bacteria, UA MANY (A) NONE SEEN   Squamous Epithelial / LPF 11-20 0 - 5  Blood culture (routine x 2)     Status: None (Preliminary result)   Collection Time: 01/10/20  8:45 PM   Specimen: BLOOD  Result Value Ref Range   Specimen Description      BLOOD RIGHT  ANTECUBITAL Performed at [facility] [place], [address] [place], [place] [address]    Special Requests      BOTTLES DRAWN AEROBIC AND ANAEROBIC Blood Culture adequate volume Performed at [facility] [place], [address] [place], [place] [address]    Culture  Setup Time      GRAM NEGATIVE RODS ANAEROBIC BOTTLE ONLY Organism ID to follow Performed at [facility], [address] [place], [place] [address]    Culture GRAM NEGATIVE RODS    Report Status PENDING   Lactic acid, plasma     Status: None   Collection Time: 01/10/20  8:49 PM  Result Value Ref Range   Lactic Acid, Venous 1.4 0.5 - 1.9 mmol/L  Glucose, capillary     Status: None   Collection Time: 01/11/20  4:11 AM  Result Value Ref Range   Glucose-Capillary 78 70 - 99 mg/dL  Glucose, capillary     Status: None   Collection Time: 01/11/20  6:27 AM  Result Value Ref Range   Glucose-Capillary 83 70 - 99 mg/dL  Magnesium     Status: None   Collection Time: 01/11/20  9:31 AM  Result Value Ref Range   Magnesium 2.2 1.7 - 2.4 mg/dL  HIV Antibody (routine testing w rflx)     Status: None   Collection Time: 01/11/20  9:31 AM  Result Value Ref Range   HIV Screen 4th Generation wRfx Non Reactive Non Reactive  CBC     Status: Abnormal  Collection Time: 01/11/20  9:31 AM  Result Value Ref Range   WBC 13.8 (H) 4.0 - 10.5 K/uL   RBC 3.74 (L) 3.87 - 5.11 MIL/uL   Hemoglobin 10.2 (L) 12.0 - 15.0 g/dL   HCT 30.5 (L) 36.0 - 46.0 %   MCV 81.6 80.0 - 100.0 fL   MCH 27.3 26.0 - 34.0 pg   MCHC 33.4 30.0 - 36.0 g/dL   RDW 16.2 (H) 11.5 - 15.5 %   Platelets 144 (L) 150 - 400 K/uL   nRBC 0.0 0.0 - 0.2 %  Comprehensive metabolic panel     Status: Abnormal   Collection Time: 01/11/20  9:31 AM  Result Value Ref Range   Sodium 131 (L) 135 - 145 mmol/L   Potassium 3.4 (L) 3.5 - 5.1 mmol/L   Chloride 95 (L) 98 - 111 mmol/L   CO2 18 (L) 22 - 32 mmol/L   Glucose, Bld 114 (H) 70 - 99 mg/dL   BUN 117 (H) 8 - 23  mg/dL   Creatinine, Ser 5.58 (H) 0.44 - 1.00 mg/dL   Calcium 5.2 (LL) 8.9 - 10.3 mg/dL   Total Protein 6.0 (L) 6.5 - 8.1 g/dL   Albumin 2.6 (L) 3.5 - 5.0 g/dL   AST 31 15 - 41 U/L   ALT 31 0 - 44 U/L   Alkaline Phosphatase 86 38 - 126 U/L   Total Bilirubin 0.8 0.3 - 1.2 mg/dL   GFR, Estimated 8 (L) >60 mL/min   Anion gap 18 (H) 5 - 15    Recent Results (from the past 240 hour(s))  Resp Panel by RT-PCR (Flu A&B, Covid) Nasopharyngeal Swab     Status: None   Collection Time: 01/10/20  6:50 PM   Specimen: Nasopharyngeal Swab; Nasopharyngeal(NP) swabs in vial transport medium  Result Value Ref Range Status   SARS Coronavirus 2 by RT PCR NEGATIVE NEGATIVE Final    Comment: (NOTE) SARS-CoV-2 target nucleic acids are NOT DETECTED.  The SARS-CoV-2 RNA is generally detectable in upper respiratory specimens during the acute phase of infection. The lowest concentration of SARS-CoV-2 viral copies this assay can detect is 138 copies/mL. A negative result does not preclude SARS-Cov-2 infection and should not be used as the sole basis for treatment or other patient management decisions. A negative result may occur with  improper specimen collection/handling, submission of specimen other than nasopharyngeal swab, presence of viral mutation(s) within the areas targeted by this assay, and inadequate number of viral copies(<138 copies/mL). A negative result must be combined with clinical observations, patient history, and epidemiological information. The expected result is Negative.  Fact Sheet for Patients:  EntrepreneurPulse.com.au  Fact Sheet for Healthcare Providers:  IncredibleEmployment.be  This test is no t yet approved or cleared by the [place] FDA and  has been authorized for detection and/or diagnosis of SARS-CoV-2 by FDA under an Emergency Use Authorization (EUA). This EUA will remain  in effect (meaning this test can be used) for the  duration of the COVID-19 declaration under Section 564(b)(1) of the Act, 21 U.S.C.section 360bbb-3(b)(1), unless the authorization is terminated  or revoked sooner.       Influenza A by PCR NEGATIVE NEGATIVE Final   Influenza B by PCR NEGATIVE NEGATIVE Final    Comment: (NOTE) The Xpert Xpress SARS-CoV-2/FLU/RSV plus assay is intended as an aid in the diagnosis of influenza from Nasopharyngeal swab specimens and should not be used as a sole basis for treatment. Nasal washings and aspirates are unacceptable  for Xpert Xpress SARS-CoV-2/FLU/RSV testing.  Fact Sheet for Patients: BloggerCourse.com  Fact Sheet for Healthcare Providers: SeriousBroker.it  This test is not yet approved or cleared by the [place] FDA and has been authorized for detection and/or diagnosis of SARS-CoV-2 by FDA under an Emergency Use Authorization (EUA). This EUA will remain in effect (meaning this test can be used) for the duration of the COVID-19 declaration under Section 564(b)(1) of the Act, 21 U.S.C. section 360bbb-3(b)(1), unless the authorization is terminated or revoked.  Performed at [facility], [address] [place], [place] [address]   Blood culture (routine x 2)     Status: None (Preliminary result)   Collection Time: 01/10/20  8:45 PM   Specimen: BLOOD  Result Value Ref Range Status   Specimen Description   Final    BLOOD RIGHT ANTECUBITAL Performed at [facility], [address] [place], [place] [address]    Special Requests   Final    BOTTLES DRAWN AEROBIC AND ANAEROBIC Blood Culture adequate volume Performed at [facility], [address] [place], [place] [address]    Culture  Setup Time   Final    GRAM NEGATIVE RODS ANAEROBIC BOTTLE ONLY Organism ID to follow Performed at [facility], [address], [place], [place] [address]    Culture GRAM NEGATIVE RODS  Final   Report  Status PENDING  Incomplete     Radiology Studies: CT Abdomen Pelvis Wo Contrast  Result Date: 01/10/2020 CLINICAL DATA:  Lower abdominal pain. EXAM: CT ABDOMEN AND PELVIS WITHOUT CONTRAST TECHNIQUE: Multidetector CT imaging of the abdomen and pelvis was performed following the standard protocol without IV contrast. COMPARISON:  September 20, 2016 FINDINGS: Lower chest: Very mild atelectasis is seen within the bilateral lung bases. Hepatobiliary: No focal liver abnormality is seen. No gallstones, gallbladder wall thickening, or biliary dilatation. Pancreas: Unremarkable. No pancreatic ductal dilatation or surrounding inflammatory changes. Spleen: Normal in size without focal abnormality. Adrenals/Urinary Tract: Adrenal glands are unremarkable. Kidneys are normal in size. A 2.4 cm x 1.7 cm well-defined, exophytic area of low attenuation is seen within the anteromedial aspect of the mid to upper right kidney. This area is decreased in size when compared to the prior study. A stable 2.6 cm x 1.9 cm, exophytic, well-defined mildly hyperdense area (approximately 39.78 Hounsfield units) is seen along the posterior aspect of the mid left kidney. 3 mm calcifications are seen within the medial aspect of the mid left kidney. Very mild diffuse calcification of the renal pyramids is also noted on the left. A 7 mm obstructing renal stone is noted within the proximal left ureter with moderate severity left-sided hydronephrosis and hydroureter. A Foley catheter is seen within an empty urinary bladder. Stomach/Bowel: There is a small hiatal hernia. Appendix appears normal. No evidence of bowel dilatation. Noninflamed diverticula are seen within the distal descending and sigmoid colon. Vascular/Lymphatic: No significant vascular findings are present. No enlarged abdominal or pelvic lymph nodes. Reproductive: The uterus is not clearly identified. This area is limited in evaluation secondary to the presence of overlying streak  artifact. Other: A 2.6 cm x 1.7 cm fat containing umbilical hernia is seen. There is no evidence of abdominal or pelvic free fluid. Musculoskeletal: Bilateral total hip replacements are seen with an extensive amount of associated streak artifact. Subsequently limited evaluation of the adjacent osseous and soft tissue structures is noted. IMPRESSION: 1. 7 mm obstructing renal stone within the proximal  left ureter. 2. Findings likely consistent with complex bilateral renal cysts. This corresponds to the findings seen on the prior abdomen and pelvis CT and prior renal ultrasound. A hemorrhagic component involving the lesion within the left kidney cannot be excluded. MRI correlation is recommended to further characterize these areas. 3. Colonic diverticulosis. 4. Small hiatal hernia. 5. Fat-containing umbilical hernia. 6. Bilateral total hip replacements. Electronically Signed   By: Aram Candela M.D.   On: 01/10/2020 20:06   DG C-Arm 1-60 Min-No Report  Result Date: 01/11/2020 Fluoroscopy was utilized by the requesting physician.  No radiographic interpretation.   DG C-Arm 1-60 Min-No Report  Final Result    CT Abdomen Pelvis Wo Contrast  Final Result      Scheduled Meds: . amLODipine  10 mg Oral Daily  . aspirin  81 mg Oral Daily  . atorvastatin  20 mg Oral Daily  . calcitRIOL  0.5 mcg Oral BID  . calcium-vitamin D  1 tablet Oral BID  . carvedilol  25 mg Oral BID WC  . Chlorhexidine Gluconate Cloth  6 each Topical Daily  . fentaNYL (SUBLIMAZE) injection  50 mcg Intravenous Once  . heparin  5,000 Units Subcutaneous Q8H  . hydrALAZINE  100 mg Oral TID  . [START ON 01/12/2020] levothyroxine  175 mcg Oral QAC breakfast   PRN Meds: acetaminophen **OR** acetaminophen Continuous Infusions: . calcium gluconate 100 mL/hr at 01/11/20 1340  . cefTRIAXone (ROCEPHIN)  IV    . lactated ringers       LOS: 0 days  Time spent: Greater than 50% of the 35 minute visit was spent in  counseling/coordination of care for the patient as laid out in the A&P.   Lewie Chamber, MD [facility] 01/11/2020, 2:03 PM

## 2020-01-11 NOTE — Op Note (Signed)
Preoperative diagnosis:  1. Left ureteral stone 2. Acute kidney injury 3. Urinary tract infection   Postoperative diagnosis:  1. Left ureteral stone 2. Acute kidney injury 3. Urinary tract infection   Procedure:  1. Cystoscopy 2. Left ureteral stent placement (6 x 24 - no string)  3. Left retrograde pyelography with interpretation  Surgeon: Moody Bruins. M.D.  Anesthesia: General  Complications: None  Intraoperative findings: Left retrograde pyelography was performed with a 6 Fr ureteral catheter and omnipaque contrast.  This demonstrated a filling defect in the proximal left ureter consistent with the patients known calculus.  EBL: Minimal  Specimens: None  Indication: Margaret [name] is a 70 y.o. patient with left ureteral obstruction due to a 7 mm left ureteral stone with acute kidney injury and urinary tract infection. After reviewing the management options for treatment, he elected to proceed with the above surgical procedure(s). We have discussed the potential benefits and risks of the procedure, side effects of the proposed treatment, the likelihood of the patient achieving the goals of the procedure, and any potential problems that might occur during the procedure or recuperation. Informed consent has been obtained.  Description of procedure:  The patient was taken to the operating room and general anesthesia was induced.  The patient was placed in the dorsal lithotomy position, prepped and draped in the usual sterile fashion, and preoperative antibiotics were administered. A preoperative time-out was performed.   Cystourethroscopy was performed.  The patient's urethra was examined and was normal. The bladder was then systematically examined in its entirety. There was no evidence for any bladder tumors, stones, or other mucosal pathology.    Attention then turned to the left ureteral orifice and a ureteral catheter was used to intubate the ureteral orifice.   Omnipaque contrast was injected through the ureteral catheter and a retrograde pyelogram was performed with findings as dictated above.  A 0.38 sensor guidewire was then advanced up the left ureter into the renal pelvis under fluoroscopic guidance.  The wire was then backloaded through the cystoscope and a ureteral stent was advance over the wire using Seldinger technique.  The stent was positioned appropriately under fluoroscopic and cystoscopic guidance.  The wire was then removed with an adequate stent curl noted in the renal pelvis as well as in the bladder.  The bladder was then emptied and the procedure ended.  The patient appeared to tolerate the procedure well and without complications.  The patient was able to be awakened and transferred to the recovery unit in satisfactory condition.    Moody Bruins MD

## 2020-01-11 NOTE — Assessment & Plan Note (Signed)
-  Replete and recheck as needed -See hypoparathyroidism

## 2020-01-11 NOTE — Anesthesia Preprocedure Evaluation (Addendum)
Anesthesia Evaluation  Patient identified by MRN, date of birth, ID band Patient awake    Reviewed: Allergy & Precautions, H&P , NPO status , Patient's Chart, lab work & pertinent test results, reviewed documented beta blocker date and time   Airway Mallampati: II  TM Distance: >3 FB Neck ROM: Full    Dental no notable dental hx. (+) Teeth Intact, Dental Advisory Given   Pulmonary neg pulmonary ROS,    Pulmonary exam normal breath sounds clear to auscultation       Cardiovascular hypertension, Pt. on medications and Pt. on home beta blockers  Rhythm:Regular Rate:Normal     Neuro/Psych negative neurological ROS  negative psych ROS   GI/Hepatic negative GI ROS, Neg liver ROS,   Endo/Other  diabetesHypothyroidism Morbid obesity  Renal/GU negative Renal ROS  negative genitourinary   Musculoskeletal  (+) Arthritis , Osteoarthritis,    Abdominal   Peds  Hematology negative hematology ROS (+)   Anesthesia Other Findings   Reproductive/Obstetrics negative OB ROS                            Anesthesia Physical Anesthesia Plan  ASA: III and emergent  Anesthesia Plan: General   Post-op Pain Management:    Induction: Intravenous  PONV Risk Score and Plan: 4 or greater and Ondansetron and Treatment may vary due to age or medical condition  Airway Management Planned: LMA  Additional Equipment:   Intra-op Plan:   Post-operative Plan: Extubation in OR  Informed Consent: I have reviewed the patients History and Physical, chart, labs and discussed the procedure including the risks, benefits and alternatives for the proposed anesthesia with the patient or authorized representative who has indicated his/her understanding and acceptance.     Dental advisory given  Plan Discussed with: CRNA  Anesthesia Plan Comments:         Anesthesia Quick Evaluation

## 2020-01-11 NOTE — Anesthesia Procedure Notes (Signed)
Procedure Name: LMA Insertion Performed by: Calix Heinbaugh J, CRNA Pre-anesthesia Checklist: Patient identified, Emergency Drugs available, Suction available, Patient being monitored and Timeout performed Patient Re-evaluated:Patient Re-evaluated prior to induction Oxygen Delivery Method: Circle system utilized Preoxygenation: Pre-oxygenation with 100% oxygen Induction Type: IV induction Ventilation: Mask ventilation without difficulty LMA: LMA inserted LMA Size: 4.0 Number of attempts: 1 Placement Confirmation: positive ETCO2 and breath sounds checked- equal and bilateral Tube secured with: Tape Dental Injury: Teeth and Oropharynx as per pre-operative assessment        

## 2020-01-11 NOTE — Transfer of Care (Signed)
Immediate Anesthesia Transfer of Care Note  Patient: Margaret [name]  Procedure(s) Performed: CYSTOSCOPY WITH URETEROSCOPY AND STENT PLACEMENT (Left Ureter)  Patient Location: PACU  Anesthesia Type:General  Level of Consciousness: sedated, patient cooperative and responds to stimulation  Airway & Oxygen Therapy: Patient Spontanous Breathing and Patient connected to face mask oxygen  Post-op Assessment: Report given to RN and Post -op Vital signs reviewed and stable  Post vital signs: Reviewed and stable  Last Vitals:  Vitals Value Taken Time  BP    Temp    Pulse 79 01/11/20 0406  Resp 13 01/11/20 0406  SpO2 100 % 01/11/20 0406  Vitals shown include unvalidated device data.  Last Pain:  Vitals:   01/10/20 1642  TempSrc:   PainSc: 8          Complications: No complications documented.

## 2020-01-11 NOTE — H&P (Signed)
History and Physical    Margaret [name] IHK:[id number] DOB: [birth year] DOA: 01/10/2020  PCP: Bailey Mech, PA-C   Patient coming from:  Home via [name]  Chief Complaint: Lower abdominal pain  HPI: Margaret [name] is a 70 y.o. female with medical history significant for diet controlled diabetes, hypertension, hypothyroidism, stage III kidney disease that presents the emerge department today for lower abdominal pain for the past few days. Pain has progressively worsened. She reports associated  nausea, vomiting.  States that she has NOT been able to tolerate p.o. for past few days. She has not been able to take her medications due to the nausea and vomiting. States that abdominal pain is generalized in lower abdomen and she also complaining of flank pain on the left side.  Patient states that she has not been able to make urine in the past couple of days becaus she has not been able to eat or drink anything.  Has been vaccinated against Covid, has been boosted.  Denies any sick contacts. Reports having a subjective fever and chills the past two days. [facility] follows [name] for her kidney disease.  States that she has not seen anyone over a week while she is been feeling ill..  Denies any chest pain or shortness of breath.  ED Course: Found to have a 7 mm obstructing stone in the left ureter.  Found to have acute renal failure with creatinine over 6.  Urology was consulted by the emergency room and is planning to take patient to the operating room for stone removal and stent placement.  Hospitalist service asked to admit for further management  Review of Systems:  General: Denies weakness, weight loss, night sweats.  Denies dizziness. Reports decreased appetite HENT: Denies head trauma, headache, denies change in hearing, tinnitus.  Denies nasal congestion or bleeding.  Denies sore throat, sores in mouth.  Denies difficulty swallowing Eyes: Denies blurry vision, pain in  eye, drainage.  Denies discoloration of eyes. Neck: Denies pain.  Denies swelling.  Denies pain with movement. Cardiovascular: Denies chest pain, palpitations.  Denies edema.  Denies orthopnea Respiratory: Denies shortness of breath, cough.  Denies wheezing.  Denies sputum production Gastrointestinal: Reports abdominal pain. Reports nausea, vomiting but no diarrhea.  Denies melena.  Denies hematemesis. Musculoskeletal: Denies limitation of movement.  Denies deformity or swelling.  Denies pain.  Denies arthralgias or myalgias. Genitourinary: Denies pelvic pain.  Denies urinary frequency or hesitancy.  Denies dysuria.  Skin: Denies rash.  Denies petechiae, purpura, ecchymosis. Neurological: Denies headache.  Denies syncope.  Denies seizure activity.  Denies weakness or paresthesia.  Denies slurred speech, drooping face.  Denies visual change. Psychiatric: Denies depression, anxiety. Denies hallucinations.  Past Medical History:  Diagnosis Date  . Diabetes mellitus without complication (HCC)   . Hypertension   . Hypothyroid   . Renal disorder     History reviewed. No pertinent surgical history.  Social History  reports that she has never smoked. She has never used smokeless tobacco. She reports current alcohol use. She reports that she does not use drugs.  Allergies  Allergen Reactions  . Latex Itching  . Nsaids Other (See Comments)    Contraindicated due to Kidney Disease  . Penicillins Rash    rash    History reviewed. No pertinent family history.   Prior to Admission medications   Medication Sig Start Date End Date Taking? Authorizing Provider  amLODipine (NORVASC) 10 MG tablet Take 1 tablet (10 mg total) by mouth  daily. For HTN 05/12/19  Yes Lassen, Arlo C, PA-C  atorvastatin (LIPITOR) 20 MG tablet Take 1 tablet (20 mg total) by mouth daily. 05/12/19  Yes Lassen, Arlo C, PA-C  calcitRIOL (ROCALTROL) 0.5 MCG capsule Take 1 capsule (0.5 mcg total) by mouth 2 (two) times daily. FOR  HYPOCALCEMIA 05/12/19  Yes Lassen, Arlo C, PA-C  calcium-vitamin D (OSCAL WITH D) 500-200 MG-UNIT tablet Take 1 tablet by mouth 2 (two) times daily. 05/12/19  Yes Lassen, Arlo C, PA-C  carvedilol (COREG) 25 MG tablet Take 1 tablet (25 mg total) by mouth 2 (two) times daily with a meal. 05/12/19  Yes Lassen, Arlo C, PA-C  ferrous sulfate 325 (65 FE) MG tablet Take 325 mg by mouth daily with breakfast. For Anemia   Yes [provider]  furosemide (LASIX) 20 MG tablet Take 1 tablet (20 mg total) by mouth daily as needed (For Lower Leg Swelling). 05/12/19  Yes Lassen, Arlo C, PA-C  hydrALAZINE (APRESOLINE) 100 MG tablet Take 1 tablet (100 mg total) by mouth 3 (three) times daily. 05/12/19  Yes Wille Celeste, PA-C  levothyroxine (SYNTHROID) 175 MCG tablet Take 1 tablet (175 mcg total) by mouth daily before breakfast. 05/12/19  Yes Oscar La, Arlo C, PA-C  lisinopril (ZESTRIL) 40 MG tablet Take 1 tablet (40 mg total) by mouth daily. 05/12/19  Yes Wille Celeste, PA-C  NON FORMULARY DIET: REGULAR, NAS/CCD   Yes [provider]  vitamin B-12 (CYANOCOBALAMIN) 1000 MCG tablet Take 1 tablet (1,000 mcg total) by mouth daily. 05/12/19  Yes Oscar La, Arlo C, PA-C  aspirin 81 MG chewable tablet Chew by mouth 2 (two) times daily.    [provider]  bisacodyl (DULCOLAX) 10 MG suppository Place 10 mg rectally daily as needed for moderate constipation. FOR UP TO 10 DAYS (IF NO STOOL IN 24 HOURS WHILE RECIEVING OPIODS)    [provider]  calcium carbonate (TUMS - DOSED IN MG ELEMENTAL CALCIUM) 500 MG chewable tablet Chew 1 tablet by mouth 3 (three) times daily with meals.    [provider]    Physical Exam: Vitals:   01/10/20 2330 01/10/20 2345 01/11/20 0055 01/11/20 0145  BP: (!) 147/92 (!) 163/91 (!) 159/88 (!) 153/82  Pulse: 77 77 82 81  Resp: 17 19 (!) 23 17  Temp:      TempSrc:      SpO2: 100% 98% 100% 99%  Weight:      Height:        Constitutional: NAD, calm,  comfortable Vitals:   01/10/20 2330 01/10/20 2345 01/11/20 0055 01/11/20 0145  BP: (!) 147/92 (!) 163/91 (!) 159/88 (!) 153/82  Pulse: 77 77 82 81  Resp: 17 19 (!) 23 17  Temp:      TempSrc:      SpO2: 100% 98% 100% 99%  Weight:      Height:       General: WDWN, Alert and oriented x3.  Eyes: EOMI, PERRL, conjunctivae normal.  Sclera nonicteric HENT:  [place]/AT, external ears normal.  Nares patent without epistasis.  Mucous membranes are moist. Neck: Soft, normal range of motion, supple, no masses, Trachea midline Respiratory: clear to auscultation bilaterally, no wheezing, no crackles. Normal respiratory effort. No accessory muscle use.  Cardiovascular: Regular rate and rhythm, no murmurs / rubs / gallops. No extremity edema. Abdomen: Soft, diffuse lower abdominal tenderness, nondistended, no rebound or guarding. Left CVA tenderness. Morbidly obese.  No masses palpated. Normoactive bowel sounds Musculoskeletal: FROM. no cyanosis.  No joint deformity upper and lower extremities. Normal muscle tone.  Skin: Warm, dry, intact no rashes, lesions, ulcers. No induration Neurologic: CN 2-12 grossly intact.  Normal speech.  Sensation intact,  Strength 5/5 in all extremities.   Psychiatric: Normal judgment and insight.  Normal mood.    Labs on Admission: I have personally reviewed following labs and imaging studies  CBC: Recent Labs  Lab 01/10/20 1651 01/10/20 1859  WBC 14.3*  --   HGB 13.2 13.9  HCT 39.2 41.0  MCV 81.5  --   PLT 177  --     Basic Metabolic Panel: Recent Labs  Lab 01/10/20 1651 01/10/20 1850 01/10/20 1859  NA 132* 133* 132*  K 2.9* 3.3* 3.2*  CL 90* 90*  --   CO2 18* 20*  --   GLUCOSE 108* 107*  --   BUN 116* 115*  --   CREATININE 6.42* 6.51*  --   CALCIUM 5.8* 5.8*  --     GFR: Estimated Creatinine Clearance: 8.8 mL/min (A) (by C-G formula based on SCr of 6.51 mg/dL (H)).  Liver Function Tests: Recent Labs  Lab 01/10/20 1651  AST 52*  ALT 42  ALKPHOS  97  BILITOT 0.7  PROT 7.7  ALBUMIN 3.2*    Urine analysis:    Component Value Date/Time   COLORURINE YELLOW 01/10/2020 1909   APPEARANCEUR CLOUDY (A) 01/10/2020 1909   LABSPEC 1.010 01/10/2020 1909   PHURINE 6.5 01/10/2020 1909   GLUCOSEU NEGATIVE 01/10/2020 1909   HGBUR MODERATE (A) 01/10/2020 1909   BILIRUBINUR NEGATIVE 01/10/2020 1909   KETONESUR NEGATIVE 01/10/2020 1909   PROTEINUR 30 (A) 01/10/2020 1909   UROBILINOGEN 1.0 11/22/2011 1242   NITRITE NEGATIVE 01/10/2020 1909   LEUKOCYTESUR LARGE (A) 01/10/2020 1909    Radiological Exams on Admission: CT Abdomen Pelvis Wo Contrast  Result Date: 01/10/2020 CLINICAL DATA:  Lower abdominal pain. EXAM: CT ABDOMEN AND PELVIS WITHOUT CONTRAST TECHNIQUE: Multidetector CT imaging of the abdomen and pelvis was performed following the standard protocol without IV contrast. COMPARISON:  September 20, 2016 FINDINGS: Lower chest: Very mild atelectasis is seen within the bilateral lung bases. Hepatobiliary: No focal liver abnormality is seen. No gallstones, gallbladder wall thickening, or biliary dilatation. Pancreas: Unremarkable. No pancreatic ductal dilatation or surrounding inflammatory changes. Spleen: Normal in size without focal abnormality. Adrenals/Urinary Tract: Adrenal glands are unremarkable. Kidneys are normal in size. A 2.4 cm x 1.7 cm well-defined, exophytic area of low attenuation is seen within the anteromedial aspect of the mid to upper right kidney. This area is decreased in size when compared to the prior study. A stable 2.6 cm x 1.9 cm, exophytic, well-defined mildly hyperdense area (approximately 39.78 Hounsfield units) is seen along the posterior aspect of the mid left kidney. 3 mm calcifications are seen within the medial aspect of the mid left kidney. Very mild diffuse calcification of the renal pyramids is also noted on the left. A 7 mm obstructing renal stone is noted within the proximal left ureter with moderate severity  left-sided hydronephrosis and hydroureter. A Foley catheter is seen within an empty urinary bladder. Stomach/Bowel: There is a small hiatal hernia. Appendix appears normal. No evidence of bowel dilatation. Noninflamed diverticula are seen within the distal descending and sigmoid colon. Vascular/Lymphatic: No significant vascular findings are present. No enlarged abdominal or pelvic lymph nodes. Reproductive: The uterus is not clearly identified. This area is limited in evaluation secondary to the presence of overlying streak artifact. Other: A 2.6 cm x  1.7 cm fat containing umbilical hernia is seen. There is no evidence of abdominal or pelvic free fluid. Musculoskeletal: Bilateral total hip replacements are seen with an extensive amount of associated streak artifact. Subsequently limited evaluation of the adjacent osseous and soft tissue structures is noted. IMPRESSION: 1. 7 mm obstructing renal stone within the proximal left ureter. 2. Findings likely consistent with complex bilateral renal cysts. This corresponds to the findings seen on the prior abdomen and pelvis CT and prior renal ultrasound. A hemorrhagic component involving the lesion within the left kidney cannot be excluded. MRI correlation is recommended to further characterize these areas. 3. Colonic diverticulosis. 4. Small hiatal hernia. 5. Fat-containing umbilical hernia. 6. Bilateral total hip replacements. Electronically Signed   By: Virgina Norfolk M.D.   On: 01/10/2020 20:06    EKG: Independently reviewed.  EKG shows normal sinus rhythm with nonspecific ST changes.  No acute ST elevation or depression.  QTc prolonged at 514  Assessment/Plan Principal Problem:   Acute renal failure (ARF) Ms. Tito is admitted to med-surg floor with telemetry monitor. She has ARF secondary to obstruction by ureteral stone.  Urology is consulted. IV fluid hydration with LR overnight. Recheck electrolytes renal function in morning  Active Problems:    Ureteral obstruction Urology has been consulted and is planning to take patient to the operating room for stone removal and stent placement.    Complicated UTI (urinary tract infection) Rocephin for antibiotic coverage.  Cultures obtained emergency room and will be monitored. Check CBC in morning    Hypocalcemia  she was given IV calcium in the emergency room.  reviewed her history and she has history of low calcium levels. Continue oral replacements that she takes daily    Hypokalemia Potassium will be repleted.  Check magnesium level and if low will replete. Recheck electrolytes renal function morning    Prolonged QT interval Avoid medications which could further prolong QT interval.  Monitor with telemetry    Obesity, Class III, BMI 40-49.9 (morbid obesity)  Follow-up with PCP for dietary, lifestyle and pharmacotherapy interventions for weight loss     DVT prophylaxis: Padua score elevated.  Heparin for DVT prophylaxis Code Status:   Full code Family Communication:  Diagnosis and plan discussed with patient.  Questions answered.  Further recommendations to follow as clinically indicated Disposition Plan:   Patient is from:  Home  Anticipated DC to:  Home  Anticipated DC date:  Anticipate more than 2 midnight stay to treat acute medical condition  Anticipated DC barriers: No barriers to discharge identified at this time  Consults called:  Urology who is to take patient to the operating room for stone removal and stent placement Admission status:  Inpatient   Yevonne Aline Deklan Minar MD [facility]  How to contact the [facility] Attending or Consulting provider [place] or covering provider during after hours [place], for this patient?   1. Check the care team in [facility] and look for a) attending/consulting [facility] provider listed and b) the [facility] team listed 2. Log into [email or web address] and use [place]'s universal password to access. If you do not have the password, please contact the  [name] operator. 3. Locate the [facility] provider you are looking for under [facility] and page to a number that you can be directly reached. 4. If you still have difficulty reaching the provider, please page the [facility] (Director on Call) for the Hospitalists listed on amion for assistance.  01/11/2020, 2:43 AM

## 2020-01-11 NOTE — Progress Notes (Signed)
Patient ID: Margaret [name], female   DOB: 09/10/1950, 70 y.o.   MRN: [id number]  Day of Surgery Subjective: S/P left ureteral stent early this morning.  Still drowsy but responsive.  Denies left sided pain now.  Objective: Vital signs in last 24 hours: Temp:  [98.1 F (36.7 C)-99.8 F (37.7 C)] 99.8 F (37.7 C) (01/06 0906) Pulse Rate:  [73-94] 85 (01/06 0906) Resp:  [12-33] 18 (01/06 0906) BP: (104-201)/(54-129) 126/75 (01/06 0906) SpO2:  [94 %-100 %] 100 % (01/06 0906) Weight:  [98.9 kg] 98.9 kg (01/05 1642)  Intake/Output from previous day: 01/05 0701 - 01/06 0700 In: 2050 [P.O.:400; I.V.:500; IV Piggyback:1150] Out: 200 [Urine:200] Intake/Output this shift: No intake/output data recorded.  Physical Exam:  General: Alert and oriented Abd: Non-tender, No CVAT GU: Pink tinged urine.  Lab Results: Recent Labs    01/10/20 1651 01/10/20 1859 01/11/20 0931  HGB 13.2 13.9 10.2*  HCT 39.2 41.0 30.5*     BMET Recent Labs    01/10/20 1850 01/10/20 1859 01/11/20 0931  NA 133* 132* 131*  K 3.3* 3.2* 3.4*  CL 90*  --  95*  CO2 20*  --  18*  GLUCOSE 107*  --  114*  BUN 115*  --  117*  CREATININE 6.51*  --  5.58*  CALCIUM 5.8*  --  5.2*     Studies/Results:   Assessment/Plan: 1) Left ureteral stone: S/P left ureteral stent placement.  Will proceed with definitive treatment in the future as an outpatient after resolution of AKI/UTI.  2) UTI: Culture pending.  On broad spectrum coverage.  3) AKI: Likely multifactorial related to obstruction and pre-renal causes/dehydration.  Renal function improving.   LOS: 0 days   Crecencio Mc 01/11/2020, 11:03 AM

## 2020-01-11 NOTE — Progress Notes (Signed)
PHARMACY NOTE:  ANTIMICROBIAL RENAL DOSAGE ADJUSTMENT  Current antimicrobial regimen includes a mismatch between antimicrobial dosage and estimated renal function.  As per policy approved by the Pharmacy & Therapeutics and Medical Executive Committees, the antimicrobial dosage will be adjusted accordingly.  Current antimicrobial dosage:  Ceftriaxone 1 gm every 24 hours  Indication: UTI   Renal Function:  Estimated Creatinine Clearance: 10.2 mL/min (A) (by C-G formula based on SCr of 5.58 mg/dL (H)). []      On intermittent HD, scheduled: []      On CRRT    Antimicrobial dosage has been changed to:  Ceftriaxone 2 gm every 24 hours   Additional comments: Noted gram negative rods now growing in blood cultures with BCID pending. Will increase ceftriaxone to 2 gm.    Thank you for allowing pharmacy to be a part of this patient's care.  , PharmD, BCPS, BCIDP Infectious Diseases Clinical Pharmacist Phone: [phone] 01/11/2020 2:21 PM

## 2020-01-11 NOTE — Anesthesia Postprocedure Evaluation (Signed)
Anesthesia Post Note  Patient: Margaret [name]  Procedure(s) Performed: CYSTOSCOPY WITH URETEROSCOPY AND STENT PLACEMENT (Left Ureter)     Patient location during evaluation: Other Anesthesia Type: General Level of consciousness: awake and alert Pain management: pain level controlled Vital Signs Assessment: post-procedure vital signs reviewed and stable Respiratory status: spontaneous breathing, nonlabored ventilation, respiratory function stable and patient connected to nasal cannula oxygen Cardiovascular status: blood pressure returned to baseline and stable Postop Assessment: no apparent nausea or vomiting Anesthetic complications: no   No complications documented.  Last Vitals:  Vitals:   01/11/20 1252 01/11/20 1706  BP: (!) 103/55 113/68  Pulse: 82 74  Resp: 20 20  Temp: 37.1 C 36.7 C  SpO2: 97% 100%    Last Pain:  Vitals:   01/11/20 1706  TempSrc: Axillary  PainSc:                  Kahle Mcqueen,W. EDMOND

## 2020-01-11 NOTE — ED Provider Notes (Signed)
I assumed care of this patient.  Please see previous provider note for further details of Hx, PE.  Briefly patient is a 70 y.o. female who presented as a transfer from [facility] after work-up was notable for infected obstructing renal stone and subsequent AKI.  Patient also noted to have severe hypocalcemia requiring IV calcium.  Urology was consulted who recommended emergent transfer to [facility].  They plan for cystoscope and stenting to relieve obstruction.  Notes time frame indicated.  Will reach out again to determine whether this will be done tonight or in the morning.  Urology to see patient in the ED.  Unsure if hospitalist has been consulted yet.  We will reach out to the hospitalist to have patient admitted for further work-up and management of her AKI and hypocalcemia.  1:30 AM Spoke with hospitalist who will admit patient  .Critical Care Performed by: Nira Conn, MD Authorized by: Nira Conn, MD     CRITICAL CARE Performed by: Amadeo Garnet Jessamy Torosyan Total critical care time: 30 minutes Critical care time was exclusive of separately billable procedures and treating other patients. Critical care was necessary to treat or prevent imminent or life-threatening deterioration. Critical care was time spent personally by me on the following activities: development of treatment plan with patient and/or surrogate as well as nursing, discussions with consultants, evaluation of patient's response to treatment, examination of patient, obtaining history from patient or surrogate, ordering and performing treatments and interventions, ordering and review of laboratory studies, ordering and review of radiographic studies, pulse oximetry and re-evaluation of patient's condition.      Nira Conn, MD 01/11/20 [phone]

## 2020-01-11 NOTE — Hospital Course (Addendum)
Margaret [name] is a 70 yo female with PMH DMII, HTN, hypothyroidism, CKD 3 who presented to the ER with lower abdominal pain and left-sided flank pain. She had ongoing nausea and vomiting attributed to her pain as well. She has no known prior history of nephrolithiasis or renal dysfunction.  She underwent work-up in the ER including CT abdomen/pelvis which was notable for a 7 mm obstructing left ureteral stone. There were also complex bilateral renal cysts noted. Urinalysis was consistent with infection. She was started on Rocephin and urology was consulted. Chemistries were also consistent with renal failure due to outlet obstruction from ureterolithiasis.  She was taken to the OR shortly after admission and underwent cystoscopy and left ureteral stent placement.  After stent placement, renal function started to slowly improve as well as urine output.  With further maturation of cultures, her urine culture grew Morganella and blood culture grew Proteus.  Her case was discussed with ID and antibiotics were broadened to meropenem until further culture results. Case again discussed on 1/10 with ID. Plan will be to continue meropenem during hospitalization and discharge on Cipro to complete a total of 10-day course.

## 2020-01-11 NOTE — Assessment & Plan Note (Addendum)
-   7 mm stone obstructing left ureter on CT abd/pelvis on admission - s/p left ureteral stent by urology on admission; greatly appreciate assistance - outpatient follow up with urology for definitive stone management

## 2020-01-11 NOTE — Assessment & Plan Note (Addendum)
-   patient actually follows with [facility]; nephrology note reviewed in careeverywhere; recently seen on 11/07/2019 - baseline creat 1.4 - 1.5 -Creatinine 6.42 on admission, BUN 116. CT abdomen/pelvis showed left ureterolithiasis with obstruction. Patient underwent urgent stent placement with urology on admission -Renal function has been improving as well as urine output; creatinine almost back to baseline -Continue fluids and trending renal function

## 2020-01-12 ENCOUNTER — Encounter (HOSPITAL_COMMUNITY): Payer: Self-pay | Admitting: Urology

## 2020-01-12 DIAGNOSIS — E892 Postprocedural hypoparathyroidism: Secondary | ICD-10-CM

## 2020-01-12 DIAGNOSIS — N1831 Chronic kidney disease, stage 3a: Secondary | ICD-10-CM | POA: Diagnosis not present

## 2020-01-12 DIAGNOSIS — N179 Acute kidney failure, unspecified: Secondary | ICD-10-CM | POA: Diagnosis not present

## 2020-01-12 DIAGNOSIS — R7881 Bacteremia: Secondary | ICD-10-CM

## 2020-01-12 DIAGNOSIS — N390 Urinary tract infection, site not specified: Secondary | ICD-10-CM | POA: Diagnosis not present

## 2020-01-12 LAB — COMPREHENSIVE METABOLIC PANEL
ALT: 25 U/L (ref 0–44)
ALT: 28 U/L (ref 0–44)
AST: 22 U/L (ref 15–41)
AST: 24 U/L (ref 15–41)
Albumin: 2.2 g/dL — ABNORMAL LOW (ref 3.5–5.0)
Albumin: 2.5 g/dL — ABNORMAL LOW (ref 3.5–5.0)
Alkaline Phosphatase: 65 U/L (ref 38–126)
Alkaline Phosphatase: 77 U/L (ref 38–126)
Anion gap: 15 (ref 5–15)
Anion gap: 16 — ABNORMAL HIGH (ref 5–15)
BUN: 106 mg/dL — ABNORMAL HIGH (ref 8–23)
BUN: 88 mg/dL — ABNORMAL HIGH (ref 8–23)
CO2: 19 mmol/L — ABNORMAL LOW (ref 22–32)
CO2: 19 mmol/L — ABNORMAL LOW (ref 22–32)
Calcium: 5.7 mg/dL — CL (ref 8.9–10.3)
Calcium: 6.3 mg/dL — CL (ref 8.9–10.3)
Chloride: 99 mmol/L (ref 98–111)
Chloride: 98 mmol/L (ref 98–111)
Creatinine, Ser: 5.56 mg/dL — ABNORMAL HIGH (ref 0.44–1.00)
Creatinine, Ser: 4.94 mg/dL — ABNORMAL HIGH (ref 0.44–1.00)
GFR, Estimated: 8 mL/min — ABNORMAL LOW (ref >60)
GFR, Estimated: 9 mL/min — ABNORMAL LOW (ref >60)
Glucose, Bld: 93 mg/dL (ref 70–99)
Glucose, Bld: 143 mg/dL — ABNORMAL HIGH (ref 70–99)
Potassium: 3.2 mmol/L — ABNORMAL LOW (ref 3.5–5.1)
Potassium: 3.4 mmol/L — ABNORMAL LOW (ref 3.5–5.1)
Sodium: 133 mmol/L — ABNORMAL LOW (ref 135–145)
Sodium: 133 mmol/L — ABNORMAL LOW (ref 135–145)
Total Bilirubin: 0.8 mg/dL (ref 0.3–1.2)
Total Bilirubin: 0.6 mg/dL (ref 0.3–1.2)
Total Protein: 5.4 g/dL — ABNORMAL LOW (ref 6.5–8.1)
Total Protein: 6 g/dL — ABNORMAL LOW (ref 6.5–8.1)

## 2020-01-12 LAB — CBC WITH DIFFERENTIAL/PLATELET
Abs Immature Granulocytes: 0.73 10*3/uL — ABNORMAL HIGH (ref 0.00–0.07)
Basophils Absolute: 0.1 10*3/uL (ref 0.0–0.1)
Basophils Relative: 1 %
Eosinophils Absolute: 0.1 10*3/uL (ref 0.0–0.5)
Eosinophils Relative: 1 %
HCT: 28.7 % — ABNORMAL LOW (ref 36.0–46.0)
Hemoglobin: 9.5 g/dL — ABNORMAL LOW (ref 12.0–15.0)
Immature Granulocytes: 5 %
Lymphocytes Relative: 10 %
Lymphs Abs: 1.4 10*3/uL (ref 0.7–4.0)
MCH: 27.5 pg (ref 26.0–34.0)
MCHC: 33.1 g/dL (ref 30.0–36.0)
MCV: 82.9 fL (ref 80.0–100.0)
Monocytes Absolute: 0.9 10*3/uL (ref 0.1–1.0)
Monocytes Relative: 6 %
Neutro Abs: 11.6 10*3/uL — ABNORMAL HIGH (ref 1.7–7.7)
Neutrophils Relative %: 77 %
Platelets: 170 10*3/uL (ref 150–400)
RBC: 3.46 MIL/uL — ABNORMAL LOW (ref 3.87–5.11)
RDW: 16.3 % — ABNORMAL HIGH (ref 11.5–15.5)
WBC: 14.8 10*3/uL — ABNORMAL HIGH (ref 4.0–10.5)
nRBC: 0 % (ref 0.0–0.2)

## 2020-01-12 LAB — MAGNESIUM: Magnesium: 2.2 mg/dL (ref 1.7–2.4)

## 2020-01-12 LAB — CALCIUM, IONIZED: Calcium, Ionized, Serum: <3 mg/dL — ABNORMAL LOW (ref 4.5–5.6)

## 2020-01-12 MED ORDER — POTASSIUM CHLORIDE CRYS ER 20 MEQ PO TBCR
40.0000 meq | EXTENDED_RELEASE_TABLET | Freq: Once | ORAL | Status: AC
  Administered 2020-01-12: 40 meq via ORAL
  Filled 2020-01-12: qty 2

## 2020-01-12 MED ORDER — CALCIUM GLUCONATE-NACL 2-0.675 GM/100ML-% IV SOLN
2.0000 g | Freq: Once | INTRAVENOUS | Status: AC
  Administered 2020-01-12: 2000 mg via INTRAVENOUS
  Filled 2020-01-12: qty 100

## 2020-01-12 MED ORDER — CALCIUM CARBONATE ANTACID 500 MG PO CHEW
1.0000 | CHEWABLE_TABLET | Freq: Three times a day (TID) | ORAL | Status: DC
  Administered 2020-01-12 – 2020-01-16 (×13): 200 mg via ORAL
  Filled 2020-01-12 (×13): qty 1

## 2020-01-12 MED ORDER — LACTATED RINGERS IV BOLUS
1000.0000 mL | Freq: Once | INTRAVENOUS | Status: AC
  Administered 2020-01-12: 1000 mL via INTRAVENOUS

## 2020-01-12 NOTE — Progress Notes (Signed)
Patient ID: [name], female   DOB: 03-01-1950, 70 y.o.   MRN: [id number]  1 Day Post-Op Subjective: Pt feeling slightly better.  Now awake and alert but still tired.  Has not eaten much yet.  Objective: Vital signs in last 24 hours: Temp:  [97.7 F (36.5 C)-99.8 F (37.7 C)] 97.7 F (36.5 C) (01/07 0449) Pulse Rate:  [62-89] 62 (01/07 0449) Resp:  [12-20] 12 (01/07 0449) BP: (87-149)/(52-75) 98/52 (01/07 0449) SpO2:  [95 %-100 %] 96 % (01/07 0449)  Intake/Output from previous day: 01/06 0701 - 01/07 0700 In: 2239.3 [P.O.:700; I.V.:683.7; IV Piggyback:855.6] Out: 550 [Urine:550] Intake/Output this shift: No intake/output data recorded.  Physical Exam:  General: Alert and oriented Abd: Mild tenderness over left abdomen and left CVA GU: Urine clear  Lab Results: Recent Labs    01/10/20 1859 01/11/20 0931 01/12/20 0536  HGB 13.9 10.2* 9.5*  HCT 41.0 30.5* 28.7*   CBC Latest Ref Rng & Units 01/12/2020 01/11/2020 01/10/2020  WBC 4.0 - 10.5 K/uL 14.8(H) 13.8(H) -  Hemoglobin 12.0 - 15.0 g/dL 9.5(L) 10.2(L) 13.9  Hematocrit 36.0 - 46.0 % 28.7(L) 30.5(L) 41.0  Platelets 150 - 400 K/uL 170 144(L) -     BMET Recent Labs    01/11/20 0931 01/12/20 0536  NA 131* 133*  K 3.4* 3.2*  CL 95* 99  CO2 18* 19*  GLUCOSE 114* 93  BUN 117* 106*  CREATININE 5.58* 5.56*  CALCIUM 5.2* 5.7*     Studies/Results: Urine culture pending. Blood culture: GNRs  Assessment/Plan: 1) Left ureteral calculus: S/P left ureteral stent 1/6.  Will arrange for outpatient follow up for definitive stone treatment after resolution of acute illness. 2) UTI: GNR bacteremia.  Suspect GU source. Continue appropriate coverage.  Urine culture pending. 3) AKI: Renal function stable but not improving.  Unilateral obstruction has been relieved.  Likely related to prerenal causes due to severe dehydration upon presentation.  Consider nephrology involvement if not improving.  No indication for dialysis at  this time.   LOS: 1 day   Dutch Gray 01/12/2020, 7:44 AM

## 2020-01-12 NOTE — Assessment & Plan Note (Addendum)
-  Care everywhere note reviewed -Continue Tums, calcitriol, calcium/vitamin D supplement -Continue trending CMP - decrease calcitriol to 0.25 on 1/10 b/c corrected Ca starting to trend up

## 2020-01-12 NOTE — Progress Notes (Signed)
PROGRESS NOTE    Margaret [name]   VZD:[id number]  DOB: November 30, 1950  DOA: 01/10/2020     1  PCP: Margaret Bellows, PA-C  CC: abdominal pain, N/V  Hospital Course: Margaret [name] is a 70 yo female with PMH DMII, HTN, hypothyroidism, CKD 3 who presented to the ER with lower abdominal pain and left-sided flank pain. She had ongoing nausea and vomiting attributed to her pain as well. She has no known prior history of nephrolithiasis or renal dysfunction.  She underwent work-up in the ER including CT abdomen/pelvis which was notable for a 7 mm obstructing left ureteral stone. There were also complex bilateral renal cysts noted. Urinalysis was consistent with infection. She was started on Rocephin and urology was consulted. Chemistries were also consistent with renal failure due to outlet obstruction from ureterolithiasis.  She was taken to the OR shortly after admission and underwent cystoscopy and left ureteral stent placement.  After stent placement, renal function started to slowly improve as well as urine output.   Interval History:  No events overnight.  Still weak and lethargic this morning however in setting of bacteremia, not necessarily unsurprising.  Renal function continues to remain elevated with urine output on the low side.  Informed her we would continue fluids and monitoring through today.  Old records reviewed in assessment of this patient  ROS: Constitutional: negative for chills and fevers, Respiratory: negative for cough, Cardiovascular: negative for chest pain and Gastrointestinal: negative for abdominal pain  Assessment & Plan: * Acute renal failure superimposed on stage 3a chronic kidney disease ([place]) - patient actually follows with [facility]; nephrology note reviewed in careeverywhere; recently seen on 11/07/2019 - baseline creat 1.4 - 1.5 -Creatinine 6.42 on admission, BUN 116. CT abdomen/pelvis showed left ureterolithiasis with obstruction. Patient underwent  urgent stent placement with urology on admission -Renal function slow to improve and there may be a component of ATN at this point however no indication for dialysis yet -Continue same management for now with ongoing fluids and monitoring urine output -Continue trending creatinine  Complicated UTI (urinary tract infection) -Due to obstructed ureteral stone on the left side -Continue Rocephin -Follow-up cultures.  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[place]) -Care everywhere note reviewed -Continue Tums, calcitriol, calcium/vitamin D supplement -Continue trending CMP  Metabolic acidosis, increased anion gap - likely due to uremia from obstruction - starting to improve with relief of obstruction - trend BMP - continue LR  Hypokalemia -Replete and recheck as needed  Hypocalcemia -Replete and recheck as needed -See hypoparathyroidism   Antimicrobials: Rocephin 01/10/2020>> current  DVT prophylaxis: HSQ Code Status: Full Family Communication: none present Disposition Plan: Status is: Inpatient  Remains inpatient appropriate because:Ongoing diagnostic testing needed not appropriate for outpatient work up, IV treatments appropriate due to intensity of illness or inability to take PO and Inpatient level of care appropriate due to severity of illness   Dispo: The patient is from: Home              Anticipated d/c is to: Home              Anticipated d/c date is: 2 days  Patient currently is not medically stable to d/c.   Objective: Blood pressure (!) 109/59, pulse 61, temperature (!) 97.5 F (36.4 C), temperature  source Oral, resp. rate 14, height 5\' 1"  (1.549 m), weight 98.9 kg, SpO2 100 %.  Examination: General appearance: More awake than yesterday but still lethargic and drowsy in bed Head: Normocephalic, without obvious abnormality, atraumatic Eyes: EOMI Lungs: clear to auscultation bilaterally Heart: regular rate and rhythm and S1, S2 normal Abdomen: normal findings: bowel sounds normal and soft, non-tender Extremities: No edema Skin: mobility and turgor normal Neurologic: Grossly normal  Consultants:   Urology  Procedures:   Left ureteral stent placement, 01/11/2020  Data Reviewed: I have personally reviewed following labs and imaging studies Results for orders placed or performed during the hospital encounter of 01/10/20 (from the past 24 hour(s))  Magnesium     Status: None   Collection Time: 01/12/20  5:36 AM  Result Value Ref Range   Magnesium 2.2 1.7 - 2.4 mg/dL  Comprehensive metabolic panel     Status: Abnormal   Collection Time: 01/12/20  5:36 AM  Result Value Ref Range   Sodium 133 (L) 135 - 145 mmol/L   Potassium 3.2 (L) 3.5 - 5.1 mmol/L   Chloride 99 98 - 111 mmol/L   CO2 19 (L) 22 - 32 mmol/L   Glucose, Bld 93 70 - 99 mg/dL   BUN 106 (H) 8 - 23 mg/dL   Creatinine, Ser 5.56 (H) 0.44 - 1.00 mg/dL   Calcium 5.7 (LL) 8.9 - 10.3 mg/dL   Total Protein 5.4 (L) 6.5 - 8.1 g/dL   Albumin 2.2 (L) 3.5 - 5.0 g/dL   AST 22 15 - 41 U/L   ALT 25 0 - 44 U/L   Alkaline Phosphatase 65 38 - 126 U/L   Total Bilirubin 0.8 0.3 - 1.2 mg/dL   GFR, Estimated 8 (L) >60 mL/min   Anion gap 15 5 - 15  CBC with Differential/Platelet     Status: Abnormal   Collection Time: 01/12/20  5:36 AM  Result Value Ref Range   WBC 14.8 (H) 4.0 - 10.5 K/uL   RBC 3.46 (L) 3.87 - 5.11 MIL/uL   Hemoglobin 9.5 (L) 12.0 - 15.0 g/dL   HCT 28.7 (L) 36.0 - 46.0 %   MCV 82.9 80.0 - 100.0 fL   MCH 27.5 26.0 - 34.0 pg   MCHC 33.1 30.0 - 36.0 g/dL   RDW 16.3 (H) 11.5 - 15.5 %   Platelets 170 150 - 400 K/uL    nRBC 0.0 0.0 - 0.2 %   Neutrophils Relative % 77 %   Neutro Abs 11.6 (H) 1.7 - 7.7 K/uL   Lymphocytes Relative 10 %   Lymphs Abs 1.4 0.7 - 4.0 K/uL   Monocytes Relative 6 %   Monocytes Absolute 0.9 0.1 - 1.0 K/uL   Eosinophils Relative 1 %   Eosinophils Absolute 0.1 0.0 - 0.5 K/uL   Basophils Relative 1 %   Basophils Absolute 0.1 0.0 - 0.1 K/uL   Immature Granulocytes 5 %   Abs Immature Granulocytes 0.73 (H) 0.00 - 0.07 K/uL    Recent Results (from the past 240 hour(s))  Resp Panel by RT-PCR (Flu A&B, Covid) Nasopharyngeal Swab     Status: None   Collection Time: 01/10/20  6:50 PM   Specimen: Nasopharyngeal Swab; Nasopharyngeal(NP) swabs in vial transport medium  Result Value Ref Range Status   SARS Coronavirus 2 by RT PCR NEGATIVE NEGATIVE Final  Comment: (NOTE) SARS-CoV-2 target nucleic acids are NOT DETECTED.  The SARS-CoV-2 RNA is generally detectable in upper respiratory specimens during the acute phase of infection. The lowest concentration of SARS-CoV-2 viral copies this assay can detect is 138 copies/mL. A negative result does not preclude SARS-Cov-2 infection and should not be used as the sole basis for treatment or other patient management decisions. A negative result may occur with  improper specimen collection/handling, submission of specimen other than nasopharyngeal swab, presence of viral mutation(s) within the areas targeted by this assay, and inadequate number of viral copies(<138 copies/mL). A negative result must be combined with clinical observations, patient history, and epidemiological information. The expected result is Negative.  Fact Sheet for Patients:  EntrepreneurPulse.com.au  Fact Sheet for Healthcare Providers:  IncredibleEmployment.be  This test is no t yet approved or cleared by the [place] FDA and  has been authorized for detection and/or diagnosis of SARS-CoV-2 by FDA under an Emergency Use  Authorization (EUA). This EUA will remain  in effect (meaning this test can be used) for the duration of the COVID-19 declaration under Section 564(b)(1) of the Act, 21 U.S.C.section 360bbb-3(b)(1), unless the authorization is terminated  or revoked sooner.       Influenza A by PCR NEGATIVE NEGATIVE Final   Influenza B by PCR NEGATIVE NEGATIVE Final    Comment: (NOTE) The Xpert Xpress SARS-CoV-2/FLU/RSV plus assay is intended as an aid in the diagnosis of influenza from Nasopharyngeal swab specimens and should not be used as a sole basis for treatment. Nasal washings and aspirates are unacceptable for Xpert Xpress SARS-CoV-2/FLU/RSV testing.  Fact Sheet for Patients: EntrepreneurPulse.com.au  Fact Sheet for Healthcare Providers: IncredibleEmployment.be  This test is not yet approved or cleared by the [place] FDA and has been authorized for detection and/or diagnosis of SARS-CoV-2 by FDA under an Emergency Use Authorization (EUA). This EUA will remain in effect (meaning this test can be used) for the duration of the COVID-19 declaration under Section 564(b)(1) of the Act, 21 U.S.C. section 360bbb-3(b)(1), unless the authorization is terminated or revoked.  Performed at [facility], [address] [place], [place] [address]   Culture, Urine     Status: Abnormal (Preliminary result)   Collection Time: 01/10/20  7:09 PM   Specimen: Urine, Random  Result Value Ref Range Status   Specimen Description   Final    URINE, RANDOM Performed at [facility], [address] [place], [place] [address]    Special Requests   Final    NONE Performed at [facility], [address] [place], [place] [address]    Culture (A)  Final    >=100,000 COLONIES/mL MORGANELLA MORGANII SUSCEPTIBILITIES TO FOLLOW Performed at [facility], [place] [address], [place], [place] [address]    Report Status PENDING   Incomplete  Blood culture (routine x 2)     Status: Abnormal (Preliminary result)   Collection Time: 01/10/20  8:45 PM   Specimen: BLOOD  Result Value Ref Range Status   Specimen Description   Final    BLOOD RIGHT ANTECUBITAL Performed at [facility], [address] [place], [place] [address]    Special Requests   Final    BOTTLES DRAWN AEROBIC AND ANAEROBIC Blood Culture adequate volume   Culture  Setup Time   Final    GRAM NEGATIVE RODS IN BOTH AEROBIC AND ANAEROBIC BOTTLES CRITICAL RESULT CALLED TO, READ BACK BY  AND VERIFIED WITH: PHARMD T. GREEN 1426 [id number] FCP    Culture (A)  Final    PROTEUS MIRABILIS SUSCEPTIBILITIES TO FOLLOW Performed at [facility], [address] [place], [place] [address]    Report Status PENDING  Incomplete  Blood Culture ID Panel (Reflexed)     Status: Abnormal   Collection Time: 01/10/20  8:45 PM  Result Value Ref Range Status   Enterococcus faecalis NOT DETECTED NOT DETECTED Final   Enterococcus Faecium NOT DETECTED NOT DETECTED Final   Listeria monocytogenes NOT DETECTED NOT DETECTED Final   Staphylococcus species NOT DETECTED NOT DETECTED Final   Staphylococcus aureus (BCID) NOT DETECTED NOT DETECTED Final   Staphylococcus epidermidis NOT DETECTED NOT DETECTED Final   Staphylococcus lugdunensis NOT DETECTED NOT DETECTED Final   Streptococcus species NOT DETECTED NOT DETECTED Final   Streptococcus agalactiae NOT DETECTED NOT DETECTED Final   Streptococcus pneumoniae NOT DETECTED NOT DETECTED Final   Streptococcus pyogenes NOT DETECTED NOT DETECTED Final   A.calcoaceticus-baumannii NOT DETECTED NOT DETECTED Final   Bacteroides fragilis NOT DETECTED NOT DETECTED Final   Enterobacterales DETECTED (A) NOT DETECTED Final    Comment: Enterobacterales represent a large order of gram negative bacteria, not a single organism. CRITICAL RESULT CALLED TO, READ BACK BY AND VERIFIED WITH: PHARMD T. GREEN 1426 [id number] FCP     Enterobacter cloacae complex NOT DETECTED NOT DETECTED Final   Escherichia coli NOT DETECTED NOT DETECTED Final   Klebsiella aerogenes NOT DETECTED NOT DETECTED Final   Klebsiella oxytoca NOT DETECTED NOT DETECTED Final   Klebsiella pneumoniae NOT DETECTED NOT DETECTED Final   Proteus species DETECTED (A) NOT DETECTED Final    Comment: CRITICAL RESULT CALLED TO, READ BACK BY AND VERIFIED WITH: PHARMD T. GREEN 1426 [id number] FCP    Salmonella species NOT DETECTED NOT DETECTED Final   Serratia marcescens NOT DETECTED NOT DETECTED Final   Haemophilus influenzae NOT DETECTED NOT DETECTED Final   Neisseria meningitidis NOT DETECTED NOT DETECTED Final   Pseudomonas aeruginosa NOT DETECTED NOT DETECTED Final   Stenotrophomonas maltophilia NOT DETECTED NOT DETECTED Final   Candida albicans NOT DETECTED NOT DETECTED Final   Candida auris NOT DETECTED NOT DETECTED Final   Candida glabrata NOT DETECTED NOT DETECTED Final   Candida krusei NOT DETECTED NOT DETECTED Final   Candida parapsilosis NOT DETECTED NOT DETECTED Final   Candida tropicalis NOT DETECTED NOT DETECTED Final   Cryptococcus neoformans/gattii NOT DETECTED NOT DETECTED Final   CTX-M ESBL NOT DETECTED NOT DETECTED Final   Carbapenem resistance IMP NOT DETECTED NOT DETECTED Final   Carbapenem resistance KPC NOT DETECTED NOT DETECTED Final   Carbapenem resistance NDM NOT DETECTED NOT DETECTED Final   Carbapenem resist OXA 48 LIKE NOT DETECTED NOT DETECTED Final   Carbapenem resistance VIM NOT DETECTED NOT DETECTED Final    Comment: Performed at [facility], [address], [place], [place] [address]     Radiology Studies: CT Abdomen Pelvis Wo Contrast  Result Date: 01/10/2020 CLINICAL DATA:  Lower abdominal pain. EXAM: CT ABDOMEN AND PELVIS WITHOUT CONTRAST TECHNIQUE: Multidetector CT imaging of the abdomen and pelvis was performed following the standard protocol without IV contrast. COMPARISON:  September 20, 2016 FINDINGS:  Lower chest: Very mild atelectasis is seen within the bilateral lung bases. Hepatobiliary: No focal liver abnormality is seen. No gallstones, gallbladder wall thickening, or biliary dilatation. Pancreas: Unremarkable. No pancreatic ductal dilatation or surrounding inflammatory changes. Spleen: Normal in size without focal abnormality. Adrenals/Urinary  Tract: Adrenal glands are unremarkable. Kidneys are normal in size. A 2.4 cm x 1.7 cm well-defined, exophytic area of low attenuation is seen within the anteromedial aspect of the mid to upper right kidney. This area is decreased in size when compared to the prior study. A stable 2.6 cm x 1.9 cm, exophytic, well-defined mildly hyperdense area (approximately 39.78 Hounsfield units) is seen along the posterior aspect of the mid left kidney. 3 mm calcifications are seen within the medial aspect of the mid left kidney. Very mild diffuse calcification of the renal pyramids is also noted on the left. A 7 mm obstructing renal stone is noted within the proximal left ureter with moderate severity left-sided hydronephrosis and hydroureter. A Foley catheter is seen within an empty urinary bladder. Stomach/Bowel: There is a small hiatal hernia. Appendix appears normal. No evidence of bowel dilatation. Noninflamed diverticula are seen within the distal descending and sigmoid colon. Vascular/Lymphatic: No significant vascular findings are present. No enlarged abdominal or pelvic lymph nodes. Reproductive: The uterus is not clearly identified. This area is limited in evaluation secondary to the presence of overlying streak artifact. Other: A 2.6 cm x 1.7 cm fat containing umbilical hernia is seen. There is no evidence of abdominal or pelvic free fluid. Musculoskeletal: Bilateral total hip replacements are seen with an extensive amount of associated streak artifact. Subsequently limited evaluation of the adjacent osseous and soft tissue structures is noted. IMPRESSION: 1. 7 mm  obstructing renal stone within the proximal left ureter. 2. Findings likely consistent with complex bilateral renal cysts. This corresponds to the findings seen on the prior abdomen and pelvis CT and prior renal ultrasound. A hemorrhagic component involving the lesion within the left kidney cannot be excluded. MRI correlation is recommended to further characterize these areas. 3. Colonic diverticulosis. 4. Small hiatal hernia. 5. Fat-containing umbilical hernia. 6. Bilateral total hip replacements. Electronically Signed   By: Virgina Norfolk M.D.   On: 01/10/2020 20:06   DG C-Arm 1-60 Min-No Report  Result Date: 01/11/2020 Fluoroscopy was utilized by the requesting physician.  No radiographic interpretation.   DG C-Arm 1-60 Min-No Report  Final Result    CT Abdomen Pelvis Wo Contrast  Final Result      Scheduled Meds: . aspirin  81 mg Oral Daily  . atorvastatin  20 mg Oral Daily  . calcitRIOL  0.5 mcg Oral BID  . calcium carbonate  1 tablet Oral TID WC  . calcium-vitamin D  1 tablet Oral BID  . Chlorhexidine Gluconate Cloth  6 each Topical Daily  . heparin  5,000 Units Subcutaneous Q8H  . levothyroxine  175 mcg Oral QAC breakfast   PRN Meds: acetaminophen **OR** acetaminophen Continuous Infusions: . cefTRIAXone (ROCEPHIN)  IV 2 g (01/11/20 2113)  . lactated ringers 100 mL/hr at 01/12/20 1041     LOS: 1 day  Time spent: Greater than 50% of the 35 minute visit was spent in counseling/coordination of care for the patient as laid out in the A&P.   Dwyane Dee, MD [facility] 01/12/2020, 1:06 PM

## 2020-01-12 NOTE — Progress Notes (Addendum)
CRITICAL VALUE ALERT  Critical Value:  Calcium 5.7  Date & Time Notied:  01/12/2020 3832  Provider Notified: APP Blount  Orders Received/Actions taken: Continue to monitor.  Awaiting orders.  Candie Mile

## 2020-01-12 NOTE — Progress Notes (Signed)
   01/12/20 0449  Vitals  Temp 97.7 F (36.5 C)  Temp Source Oral  BP (!) 98/52  MAP (mmHg) (!) 64  BP Location Left Arm  BP Method Automatic  Patient Position (if appropriate) Lying  Pulse Rate 62  Pulse Rate Source Monitor  Resp 12  MEWS COLOR  MEWS Score Color Yellow  Oxygen Therapy  SpO2 96 %  O2 Device Room Air  O2 Flow Rate (L/min) 2 L/min  Pain Assessment  Pain Scale 0-10  Pain Score Asleep  MEWS Score  MEWS Temp 0  MEWS Systolic 1  MEWS Pulse 0  MEWS RR 1  MEWS LOC 0  MEWS Score 2  Provider Notification  Provider Name/Title APP Blount  Date Provider Notified 01/12/20  Time Provider Notified [phone]  Notification Type Page  Notification Reason Change in status  Patient changed to yellow mews.  Provider Blount notified.  Will continue to monitor.   Candie Mile, RN

## 2020-01-12 NOTE — Assessment & Plan Note (Addendum)
-  Urine culture with Morganella - blood culture noted with Proteus, pansensitive - continue on meropenem while hospitalized.  Discussed with ID on 01/15/2020.  Plan is to discharge on ciprofloxacin to complete a total of 10-day course (end date 01/22/20)

## 2020-01-13 DIAGNOSIS — N390 Urinary tract infection, site not specified: Secondary | ICD-10-CM | POA: Diagnosis not present

## 2020-01-13 DIAGNOSIS — N179 Acute kidney failure, unspecified: Secondary | ICD-10-CM | POA: Diagnosis not present

## 2020-01-13 DIAGNOSIS — R7881 Bacteremia: Secondary | ICD-10-CM

## 2020-01-13 LAB — COMPREHENSIVE METABOLIC PANEL
ALT: 24 U/L (ref 0–44)
AST: 16 U/L (ref 15–41)
Albumin: 2.2 g/dL — ABNORMAL LOW (ref 3.5–5.0)
Alkaline Phosphatase: 67 U/L (ref 38–126)
Anion gap: 14 (ref 5–15)
BUN: 91 mg/dL — ABNORMAL HIGH (ref 8–23)
CO2: 20 mmol/L — ABNORMAL LOW (ref 22–32)
Calcium: 7.1 mg/dL — ABNORMAL LOW (ref 8.9–10.3)
Chloride: 101 mmol/L (ref 98–111)
Creatinine, Ser: 3.88 mg/dL — ABNORMAL HIGH (ref 0.44–1.00)
GFR, Estimated: 12 mL/min — ABNORMAL LOW (ref >60)
Glucose, Bld: 119 mg/dL — ABNORMAL HIGH (ref 70–99)
Potassium: 3.7 mmol/L (ref 3.5–5.1)
Sodium: 135 mmol/L (ref 135–145)
Total Bilirubin: 0.4 mg/dL (ref 0.3–1.2)
Total Protein: 5.7 g/dL — ABNORMAL LOW (ref 6.5–8.1)

## 2020-01-13 LAB — CBC WITH DIFFERENTIAL/PLATELET
Abs Immature Granulocytes: 0.98 10*3/uL — ABNORMAL HIGH (ref 0.00–0.07)
Basophils Absolute: 0.1 10*3/uL (ref 0.0–0.1)
Basophils Relative: 1 %
Eosinophils Absolute: 0.1 10*3/uL (ref 0.0–0.5)
Eosinophils Relative: 1 %
HCT: 30.1 % — ABNORMAL LOW (ref 36.0–46.0)
Hemoglobin: 10 g/dL — ABNORMAL LOW (ref 12.0–15.0)
Immature Granulocytes: 9 %
Lymphocytes Relative: 11 %
Lymphs Abs: 1.2 10*3/uL (ref 0.7–4.0)
MCH: 27.4 pg (ref 26.0–34.0)
MCHC: 33.2 g/dL (ref 30.0–36.0)
MCV: 82.5 fL (ref 80.0–100.0)
Monocytes Absolute: 0.6 10*3/uL (ref 0.1–1.0)
Monocytes Relative: 5 %
Neutro Abs: 8.4 10*3/uL — ABNORMAL HIGH (ref 1.7–7.7)
Neutrophils Relative %: 73 %
Platelets: 209 10*3/uL (ref 150–400)
RBC: 3.65 MIL/uL — ABNORMAL LOW (ref 3.87–5.11)
RDW: 16.2 % — ABNORMAL HIGH (ref 11.5–15.5)
WBC: 11.3 10*3/uL — ABNORMAL HIGH (ref 4.0–10.5)
nRBC: 0 % (ref 0.0–0.2)

## 2020-01-13 LAB — CULTURE, BLOOD (ROUTINE X 2): Special Requests: ADEQUATE

## 2020-01-13 LAB — MAGNESIUM: Magnesium: 2.1 mg/dL (ref 1.7–2.4)

## 2020-01-13 LAB — URINE CULTURE: Culture: >=100000 — AB

## 2020-01-13 MED ORDER — SODIUM CHLORIDE 0.9 % IV SOLN
500.0000 mg | Freq: Two times a day (BID) | INTRAVENOUS | Status: DC
  Administered 2020-01-13 – 2020-01-14 (×2): 500 mg via INTRAVENOUS
  Filled 2020-01-13: qty 0.5
  Filled 2020-01-13: qty 500

## 2020-01-13 MED ORDER — CALCIUM GLUCONATE-NACL 2-0.675 GM/100ML-% IV SOLN
2.0000 g | Freq: Once | INTRAVENOUS | Status: AC
  Administered 2020-01-13: 2000 mg via INTRAVENOUS
  Filled 2020-01-13: qty 100

## 2020-01-13 MED ORDER — SODIUM CHLORIDE 0.9 % IV SOLN
500.0000 mg | Freq: Two times a day (BID) | INTRAVENOUS | Status: DC
  Filled 2020-01-13: qty 0.5

## 2020-01-13 NOTE — Plan of Care (Signed)
  Problem: Clinical Measurements: Goal: Diagnostic test results will improve Outcome: Progressing   

## 2020-01-13 NOTE — Progress Notes (Signed)
Pharmacy Antibiotic Note  Margaret [name] is a 70 y.o. female admitted on 01/10/2020 with abdominal pain, N/, found to have left ureteral calculus: S/P left ureteral stent 1/6. Proteus bacteremia and Morganella in urine. Pharmacy has been consulted for meropenem dosing.  Day #4 Ceftriaxone - broaden to meropenem per ID recs - Tmax 99.2 - WBC 11.3 - AKI, SCr improved 3.88 - CrCl ~15 ml/min  Plan: Meropenem 2g IV q12h Follow up renal function & adjust as needed  Height: 5\' 1"  (154.9 cm) Weight: 98.9 kg (218 lb) IBW/kg (Calculated) : 47.8  Temp (24hrs), Avg:98.4 F (36.9 C), Min:97.5 F (36.4 C), Max:99.2 F (37.3 C)  Recent Labs  Lab 01/10/20 1651 01/10/20 1850 01/10/20 2049 01/11/20 0931 01/12/20 0536 01/12/20 1351 01/13/20 0621  WBC 14.3*  --   --  13.8* 14.8*  --  11.3*  CREATININE 6.42* 6.51*  --  5.58* 5.56* 4.94* 3.88*  LATICACIDVEN  --  1.9 1.4  --   --   --   --     Estimated Creatinine Clearance: 14.7 mL/min (A) (by C-G formula based on SCr of 3.88 mg/dL (H)).    Allergies  Allergen Reactions  . Latex Itching  . Nsaids Other (See Comments)    Contraindicated due to Kidney Disease  . Penicillins Rash    rash    Antimicrobials this admission:  1/5 CTX 1/6 incr 2g >> 1/8 1/8 Meropenem >>  Dose adjustments this admission:   Microbiology results:  1/5 BCx: Proteus mirabilis (pansensitive) 1/5 UCx: >100k Morganella morganii (S imip, R amp, ancef, nitro; I unasyn)  Thank you for allowing pharmacy to be a part of this patient's care.  Peggyann Juba, PharmD, BCPS Pharmacy: [phone] 01/13/2020 9:32 AM

## 2020-01-13 NOTE — Progress Notes (Signed)
PROGRESS NOTE    Margaret [name]   [id number]  DOB: 1950/09/28  DOA: 01/10/2020     2  PCP: Jonathon Bellows, PA-C  CC: abdominal pain, N/V  Hospital Course: Margaret [name] is a 70 yo female with PMH DMII, HTN, hypothyroidism, CKD 3 who presented to the ER with lower abdominal pain and left-sided flank pain. She had ongoing nausea and vomiting attributed to her pain as well. She has no known prior history of nephrolithiasis or renal dysfunction.  She underwent work-up in the ER including CT abdomen/pelvis which was notable for a 7 mm obstructing left ureteral stone. There were also complex bilateral renal cysts noted. Urinalysis was consistent with infection. She was started on Rocephin and urology was consulted. Chemistries were also consistent with renal failure due to outlet obstruction from ureterolithiasis.  She was taken to the OR shortly after admission and underwent cystoscopy and left ureteral stent placement.  After stent placement, renal function started to slowly improve as well as urine output.  With further maturation of cultures, her urine culture grew Morganella and blood culture grew Proteus.  Her case was discussed with ID and antibiotics were broadened to meropenem until further culture results.   Interval History:  No events overnight.  Energy slowly improving but still weak in bed.  Again reviewed her clinical course and culture results with her and discussed ongoing plan for antibiotics and watching culture results as well as further improvement of renal function.  Also needs to get up with physical therapy some now.  Old records reviewed in assessment of this patient  ROS: Constitutional: negative for chills and fevers, Respiratory: negative for cough, Cardiovascular: negative for chest pain and Gastrointestinal: negative for abdominal pain  Assessment & Plan: * Acute renal failure superimposed on stage 3a chronic kidney disease ([place]) - patient  actually follows with [facility]; nephrology note reviewed in careeverywhere; recently seen on 11/07/2019 - baseline creat 1.4 - 1.5 -Creatinine 6.42 on admission, BUN 116. CT abdomen/pelvis showed left ureterolithiasis with obstruction. Patient underwent urgent stent placement with urology on admission -Renal function has been slowly improving as well as urine output -Continue fluids and trending renal function  Complicated UTI (urinary tract infection) -Due to obstructed ureteral stone on the left side -Urine culture has now speciated to Morganella, resistant to cefazolin (no other cephalosporin sensitivities) -Discontinue Rocephin and start meropenem -Continue to follow urine culture for further results  Ureterolithiasis - 7 mm stone obstructing left ureter on CT abd/pelvis on admission - s/p left ureteral stent by urology on admission; greatly appreciate assistance - outpatient follow up with urology   Bacteremia - see complicated UTI - blood culture noted with Proteus - awaiting further culture results  -Rocephin changed to meropenem on 01/13/2020 due to urine culture results.  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[place]) -Care everywhere note reviewed -Continue Tums, calcitriol, calcium/vitamin D supplement -Continue trending CMP  Metabolic acidosis, increased anion gap - likely due to uremia from obstruction - starting to improve with relief of obstruction - trend BMP - continue LR  Hypokalemia -Replete and recheck as needed  Hypocalcemia -Replete and recheck as needed -See hypoparathyroidism   Antimicrobials: Rocephin 01/10/2020>> current  DVT prophylaxis: HSQ Code Status: Full Family Communication: none present Disposition Plan: Status is: Inpatient  Remains inpatient appropriate because:Ongoing diagnostic testing needed not appropriate for outpatient work up, IV treatments appropriate due to intensity of illness or inability to take PO and  Inpatient level of care appropriate due  to severity of illness   Dispo: The patient is from: Home              Anticipated d/c is to: Pending PT eval              Anticipated d/c date is: 3 days              Patient currently is not medically stable to d/c.   Objective: Blood pressure 139/75, pulse 73, temperature 98.1 F (36.7 C), temperature source Oral, resp. rate 14, height 5\' 1"  (1.549 m), weight 98.9 kg, SpO2 96 %.  Examination: General appearance: More awake than yesterday but still lethargic Head: Normocephalic, without obvious abnormality, atraumatic Eyes: EOMI Lungs: clear to auscultation bilaterally Heart: regular rate and rhythm and S1, S2 normal Abdomen: normal findings: bowel sounds normal and soft, non-tender Extremities: No edema Skin: mobility and turgor normal Neurologic: Grossly normal  Consultants:   Urology  Procedures:   Left ureteral stent placement, 01/11/2020  Data Reviewed: I have personally reviewed following labs and imaging studies Results for orders placed or performed during the hospital encounter of 01/10/20 (from the past 24 hour(s))  Magnesium     Status: None   Collection Time: 01/13/20  6:21 AM  Result Value Ref Range   Magnesium 2.1 1.7 - 2.4 mg/dL  Comprehensive metabolic panel     Status: Abnormal   Collection Time: 01/13/20  6:21 AM  Result Value Ref Range   Sodium 135 135 - 145 mmol/L   Potassium 3.7 3.5 - 5.1 mmol/L   Chloride 101 98 - 111 mmol/L   CO2 20 (L) 22 - 32 mmol/L   Glucose, Bld 119 (H) 70 - 99 mg/dL   BUN 91 (H) 8 - 23 mg/dL   Creatinine, Ser 3.88 (H) 0.44 - 1.00 mg/dL   Calcium 7.1 (L) 8.9 - 10.3 mg/dL   Total Protein 5.7 (L) 6.5 - 8.1 g/dL   Albumin 2.2 (L) 3.5 - 5.0 g/dL   AST 16 15 - 41 U/L   ALT 24 0 - 44 U/L   Alkaline Phosphatase 67 38 - 126 U/L   Total Bilirubin 0.4 0.3 - 1.2 mg/dL   GFR, Estimated 12 (L) >60 mL/min   Anion gap 14 5 - 15  CBC with Differential/Platelet     Status: Abnormal   Collection  Time: 01/13/20  6:21 AM  Result Value Ref Range   WBC 11.3 (H) 4.0 - 10.5 K/uL   RBC 3.65 (L) 3.87 - 5.11 MIL/uL   Hemoglobin 10.0 (L) 12.0 - 15.0 g/dL   HCT 30.1 (L) 36.0 - 46.0 %   MCV 82.5 80.0 - 100.0 fL   MCH 27.4 26.0 - 34.0 pg   MCHC 33.2 30.0 - 36.0 g/dL   RDW 16.2 (H) 11.5 - 15.5 %   Platelets 209 150 - 400 K/uL   nRBC 0.0 0.0 - 0.2 %   Neutrophils Relative % 73 %   Neutro Abs 8.4 (H) 1.7 - 7.7 K/uL   Lymphocytes Relative 11 %   Lymphs Abs 1.2 0.7 - 4.0 K/uL   Monocytes Relative 5 %   Monocytes Absolute 0.6 0.1 - 1.0 K/uL   Eosinophils Relative 1 %   Eosinophils Absolute 0.1 0.0 - 0.5 K/uL   Basophils Relative 1 %   Basophils Absolute 0.1 0.0 - 0.1 K/uL   WBC Morphology MILD LEFT SHIFT (1-5% METAS, OCC MYELO, OCC BANDS)    Immature Granulocytes 9 %   Abs Immature  Granulocytes 0.98 (H) 0.00 - 0.07 K/uL    Recent Results (from the past 240 hour(s))  Resp Panel by RT-PCR (Flu A&B, Covid) Nasopharyngeal Swab     Status: None   Collection Time: 01/10/20  6:50 PM   Specimen: Nasopharyngeal Swab; Nasopharyngeal(NP) swabs in vial transport medium  Result Value Ref Range Status   SARS Coronavirus 2 by RT PCR NEGATIVE NEGATIVE Final    Comment: (NOTE) SARS-CoV-2 target nucleic acids are NOT DETECTED.  The SARS-CoV-2 RNA is generally detectable in upper respiratory specimens during the acute phase of infection. The lowest concentration of SARS-CoV-2 viral copies this assay can detect is 138 copies/mL. A negative result does not preclude SARS-Cov-2 infection and should not be used as the sole basis for treatment or other patient management decisions. A negative result may occur with  improper specimen collection/handling, submission of specimen other than nasopharyngeal swab, presence of viral mutation(s) within the areas targeted by this assay, and inadequate number of viral copies(<138 copies/mL). A negative result must be combined with clinical observations, patient  history, and epidemiological information. The expected result is Negative.  Fact Sheet for Patients:  EntrepreneurPulse.com.au  Fact Sheet for Healthcare Providers:  IncredibleEmployment.be  This test is no t yet approved or cleared by the [place] FDA and  has been authorized for detection and/or diagnosis of SARS-CoV-2 by FDA under an Emergency Use Authorization (EUA). This EUA will remain  in effect (meaning this test can be used) for the duration of the COVID-19 declaration under Section 564(b)(1) of the Act, 21 U.S.C.section 360bbb-3(b)(1), unless the authorization is terminated  or revoked sooner.       Influenza A by PCR NEGATIVE NEGATIVE Final   Influenza B by PCR NEGATIVE NEGATIVE Final    Comment: (NOTE) The Xpert Xpress SARS-CoV-2/FLU/RSV plus assay is intended as an aid in the diagnosis of influenza from Nasopharyngeal swab specimens and should not be used as a sole basis for treatment. Nasal washings and aspirates are unacceptable for Xpert Xpress SARS-CoV-2/FLU/RSV testing.  Fact Sheet for Patients: EntrepreneurPulse.com.au  Fact Sheet for Healthcare Providers: IncredibleEmployment.be  This test is not yet approved or cleared by the [place] FDA and has been authorized for detection and/or diagnosis of SARS-CoV-2 by FDA under an Emergency Use Authorization (EUA). This EUA will remain in effect (meaning this test can be used) for the duration of the COVID-19 declaration under Section 564(b)(1) of the Act, 21 U.S.C. section 360bbb-3(b)(1), unless the authorization is terminated or revoked.  Performed at [facility], [address] [place], [place] [address]   Culture, Urine     Status: Abnormal   Collection Time: 01/10/20  7:09 PM   Specimen: Urine, Random  Result Value Ref Range Status   Specimen Description   Final    URINE, RANDOM Performed at [facility], [address] [place], [place] [address]    Special Requests   Final    NONE Performed at [facility], [address] [place], [place] [address]    Culture >=100,000 COLONIES/mL [facility] MORGANII (A)  Final   Report Status 01/13/2020 FINAL  Final   Organism ID, Bacteria MORGANELLA MORGANII (A)  Final      Susceptibility   Morganella morganii - MIC*    AMPICILLIN >=32 RESISTANT Resistant     CEFAZOLIN >=64 RESISTANT Resistant     CIPROFLOXACIN <=0.25 SENSITIVE Sensitive     GENTAMICIN <=1 SENSITIVE Sensitive  IMIPENEM 2 SENSITIVE Sensitive     NITROFURANTOIN 128 RESISTANT Resistant     TRIMETH/SULFA <=20 SENSITIVE Sensitive     AMPICILLIN/SULBACTAM 16 INTERMEDIATE Intermediate     PIP/TAZO <=4 SENSITIVE Sensitive     * >=100,000 COLONIES/mL MORGANELLA MORGANII  Blood culture (routine x 2)     Status: Abnormal   Collection Time: 01/10/20  8:45 PM   Specimen: BLOOD  Result Value Ref Range Status   Specimen Description   Final    BLOOD RIGHT ANTECUBITAL Performed at [facility], [address] [place], [place] [address]    Special Requests   Final    BOTTLES DRAWN AEROBIC AND ANAEROBIC Blood Culture adequate volume   Culture  Setup Time   Final    GRAM NEGATIVE RODS IN BOTH AEROBIC AND ANAEROBIC BOTTLES CRITICAL RESULT CALLED TO, READ BACK BY AND VERIFIED WITH: [place] [id number] FCP Performed at [facility], [place] [address], [place], [place] [address]    Culture PROTEUS MIRABILIS (A)  Final   Report Status 01/13/2020 FINAL  Final   Organism ID, Bacteria PROTEUS MIRABILIS  Final      Susceptibility   Proteus mirabilis - MIC*    AMPICILLIN <=2 SENSITIVE Sensitive     CEFAZOLIN <=4 SENSITIVE Sensitive     CEFEPIME <=0.12 SENSITIVE Sensitive     CEFTAZIDIME <=1 SENSITIVE Sensitive     CEFTRIAXONE <=0.25 SENSITIVE Sensitive     CIPROFLOXACIN <=0.25 SENSITIVE Sensitive     GENTAMICIN <=1 SENSITIVE Sensitive     IMIPENEM  2 SENSITIVE Sensitive     TRIMETH/SULFA <=20 SENSITIVE Sensitive     AMPICILLIN/SULBACTAM <=2 SENSITIVE Sensitive     PIP/TAZO <=4 SENSITIVE Sensitive     * PROTEUS MIRABILIS  Blood Culture ID Panel (Reflexed)     Status: Abnormal   Collection Time: 01/10/20  8:45 PM  Result Value Ref Range Status   Enterococcus faecalis NOT DETECTED NOT DETECTED Final   Enterococcus Faecium NOT DETECTED NOT DETECTED Final   Listeria monocytogenes NOT DETECTED NOT DETECTED Final   Staphylococcus species NOT DETECTED NOT DETECTED Final   Staphylococcus aureus (BCID) NOT DETECTED NOT DETECTED Final   Staphylococcus epidermidis NOT DETECTED NOT DETECTED Final   Staphylococcus lugdunensis NOT DETECTED NOT DETECTED Final   Streptococcus species NOT DETECTED NOT DETECTED Final   Streptococcus agalactiae NOT DETECTED NOT DETECTED Final   Streptococcus pneumoniae NOT DETECTED NOT DETECTED Final   Streptococcus pyogenes NOT DETECTED NOT DETECTED Final   A.calcoaceticus-baumannii NOT DETECTED NOT DETECTED Final   Bacteroides fragilis NOT DETECTED NOT DETECTED Final   Enterobacterales DETECTED (A) NOT DETECTED Final    Comment: Enterobacterales represent a large order of gram negative bacteria, not a single organism. CRITICAL RESULT CALLED TO, READ BACK BY AND VERIFIED WITH: PHARMD T. GREEN [id number] FCP    Enterobacter cloacae complex NOT DETECTED NOT DETECTED Final   Escherichia coli NOT DETECTED NOT DETECTED Final   Klebsiella aerogenes NOT DETECTED NOT DETECTED Final   Klebsiella oxytoca NOT DETECTED NOT DETECTED Final   Klebsiella pneumoniae NOT DETECTED NOT DETECTED Final   Proteus species DETECTED (A) NOT DETECTED Final    Comment: CRITICAL RESULT CALLED TO, READ BACK BY AND VERIFIED WITH: PHARMD T. GREEN 1426 [id number] FCP    Salmonella species NOT DETECTED NOT DETECTED Final   Serratia marcescens NOT DETECTED NOT DETECTED Final   Haemophilus influenzae NOT DETECTED NOT DETECTED Final   Neisseria  meningitidis NOT DETECTED  NOT DETECTED Final   Pseudomonas aeruginosa NOT DETECTED NOT DETECTED Final   Stenotrophomonas maltophilia NOT DETECTED NOT DETECTED Final   Candida albicans NOT DETECTED NOT DETECTED Final   Candida auris NOT DETECTED NOT DETECTED Final   Candida glabrata NOT DETECTED NOT DETECTED Final   Candida krusei NOT DETECTED NOT DETECTED Final   Candida parapsilosis NOT DETECTED NOT DETECTED Final   Candida tropicalis NOT DETECTED NOT DETECTED Final   Cryptococcus neoformans/gattii NOT DETECTED NOT DETECTED Final   CTX-M ESBL NOT DETECTED NOT DETECTED Final   Carbapenem resistance IMP NOT DETECTED NOT DETECTED Final   Carbapenem resistance KPC NOT DETECTED NOT DETECTED Final   Carbapenem resistance NDM NOT DETECTED NOT DETECTED Final   Carbapenem resist OXA 48 LIKE NOT DETECTED NOT DETECTED Final   Carbapenem resistance VIM NOT DETECTED NOT DETECTED Final    Comment: Performed at [facility], [address], [place], [place] [address]     Radiology Studies: No results found. DG C-Arm 1-60 Min-No Report  Final Result    CT Abdomen Pelvis Wo Contrast  Final Result      Scheduled Meds: . aspirin  81 mg Oral Daily  . atorvastatin  20 mg Oral Daily  . calcitRIOL  0.5 mcg Oral BID  . calcium carbonate  1 tablet Oral TID WC  . calcium-vitamin D  1 tablet Oral BID  . Chlorhexidine Gluconate Cloth  6 each Topical Daily  . heparin  5,000 Units Subcutaneous Q8H  . levothyroxine  175 mcg Oral QAC breakfast   PRN Meds: acetaminophen **OR** acetaminophen Continuous Infusions: . lactated ringers 100 mL/hr at 01/13/20 1329  . meropenem (MERREM) IV       LOS: 2 days  Time spent: Greater than 50% of the 35 minute visit was spent in counseling/coordination of care for the patient as laid out in the A&P.   Dwyane Dee, MD [facility] 01/13/2020, 3:40 PM

## 2020-01-13 NOTE — Progress Notes (Signed)
Patient ID: Margaret [name], female   DOB: [birth year], 70 y.o.   MRN: [id number]  2 Days Post-Op Subjective: Pt feeling fatigued. No pain. Urine clear..  Objective: Vital signs in last 24 hours: Temp:  [98.1 F (36.7 C)-99.2 F (37.3 C)] 98.1 F (36.7 C) (01/08 1252) Pulse Rate:  [63-73] 73 (01/08 1252) Resp:  [11-19] 14 (01/08 1252) BP: (116-160)/(63-86) 139/75 (01/08 1252) SpO2:  [96 %-100 %] 96 % (01/08 1252)  Intake/Output from previous day: 01/07 0701 - 01/08 0700 In: 1572.7 [P.O.:360; I.V.:1112.7; IV Piggyback:100] Out: 2050 [Urine:2050] Intake/Output this shift: Total I/O In: 1715.6 [P.O.:960; I.V.:755.6] Out: 750 [Urine:750]  Physical Exam:  General: Alert and oriented Abd: Mild tenderness over left abdomen and left CVA GU: Urine clear  Lab Results: Recent Labs    01/11/20 0931 01/12/20 0536 01/13/20 0621  HGB 10.2* 9.5* 10.0*  HCT 30.5* 28.7* 30.1*   CBC Latest Ref Rng & Units 01/13/2020 01/12/2020 01/11/2020  WBC 4.0 - 10.5 K/uL 11.3(H) 14.8(H) 13.8(H)  Hemoglobin 12.0 - 15.0 g/dL 10.0(L) 9.5(L) 10.2(L)  Hematocrit 36.0 - 46.0 % 30.1(L) 28.7(L) 30.5(L)  Platelets 150 - 400 K/uL 209 170 144(L)     BMET Recent Labs    01/12/20 1351 01/13/20 0621  NA 133* 135  K 3.4* 3.7  CL 98 101  CO2 19* 20*  GLUCOSE 143* 119*  BUN 88* 91*  CREATININE 4.94* 3.88*  CALCIUM 6.3* 7.1*     Studies/Results: Urine culture pending. Blood culture: GNRs  Assessment/Plan: 1) Left ureteral calculus: S/P left ureteral stent 1/6.  Will arrange for outpatient follow up for definitive stone treatment after resolution of acute illness. 2) UTI: Proteus and enterobacter bacteremia.   Continue appropriate coverage.      LOS: 2 days   Nicolette Bang 01/13/2020, 5:03 PM

## 2020-01-14 DIAGNOSIS — N179 Acute kidney failure, unspecified: Secondary | ICD-10-CM | POA: Diagnosis not present

## 2020-01-14 DIAGNOSIS — N390 Urinary tract infection, site not specified: Secondary | ICD-10-CM | POA: Diagnosis not present

## 2020-01-14 DIAGNOSIS — R7881 Bacteremia: Secondary | ICD-10-CM | POA: Diagnosis not present

## 2020-01-14 LAB — CBC WITH DIFFERENTIAL/PLATELET
Abs Immature Granulocytes: 1.54 K/uL — ABNORMAL HIGH (ref 0.00–0.07)
Basophils Absolute: 0 K/uL (ref 0.0–0.1)
Basophils Relative: 0 %
Eosinophils Absolute: 0.1 K/uL (ref 0.0–0.5)
Eosinophils Relative: 1 %
HCT: 34 % — ABNORMAL LOW (ref 36.0–46.0)
Hemoglobin: 11.3 g/dL — ABNORMAL LOW (ref 12.0–15.0)
Immature Granulocytes: 14 %
Lymphocytes Relative: 13 %
Lymphs Abs: 1.5 K/uL (ref 0.7–4.0)
MCH: 27.6 pg (ref 26.0–34.0)
MCHC: 33.2 g/dL (ref 30.0–36.0)
MCV: 83.1 fL (ref 80.0–100.0)
Monocytes Absolute: 0.7 K/uL (ref 0.1–1.0)
Monocytes Relative: 6 %
Neutro Abs: 7.7 K/uL (ref 1.7–7.7)
Neutrophils Relative %: 66 %
Platelets: 263 K/uL (ref 150–400)
RBC: 4.09 MIL/uL (ref 3.87–5.11)
RDW: 16.2 % — ABNORMAL HIGH (ref 11.5–15.5)
WBC: 11.4 K/uL — ABNORMAL HIGH (ref 4.0–10.5)
nRBC: 0 % (ref 0.0–0.2)

## 2020-01-14 LAB — COMPREHENSIVE METABOLIC PANEL WITH GFR
ALT: 22 U/L (ref 0–44)
AST: 19 U/L (ref 15–41)
Albumin: 2.4 g/dL — ABNORMAL LOW (ref 3.5–5.0)
Alkaline Phosphatase: 67 U/L (ref 38–126)
Anion gap: 11 (ref 5–15)
BUN: 72 mg/dL — ABNORMAL HIGH (ref 8–23)
CO2: 22 mmol/L (ref 22–32)
Calcium: 8 mg/dL — ABNORMAL LOW (ref 8.9–10.3)
Chloride: 102 mmol/L (ref 98–111)
Creatinine, Ser: 2.46 mg/dL — ABNORMAL HIGH (ref 0.44–1.00)
GFR, Estimated: 21 mL/min — ABNORMAL LOW
Glucose, Bld: 166 mg/dL — ABNORMAL HIGH (ref 70–99)
Potassium: 3.8 mmol/L (ref 3.5–5.1)
Sodium: 135 mmol/L (ref 135–145)
Total Bilirubin: 0.6 mg/dL (ref 0.3–1.2)
Total Protein: 6.2 g/dL — ABNORMAL LOW (ref 6.5–8.1)

## 2020-01-14 LAB — MAGNESIUM: Magnesium: 1.9 mg/dL (ref 1.7–2.4)

## 2020-01-14 MED ORDER — SENNOSIDES-DOCUSATE SODIUM 8.6-50 MG PO TABS
1.0000 | ORAL_TABLET | Freq: Two times a day (BID) | ORAL | Status: DC
  Administered 2020-01-14 – 2020-01-15 (×3): 1 via ORAL
  Filled 2020-01-14 (×5): qty 1

## 2020-01-14 MED ORDER — POLYETHYLENE GLYCOL 3350 17 G PO PACK
17.0000 g | PACK | Freq: Every day | ORAL | Status: DC
  Administered 2020-01-14 – 2020-01-15 (×2): 17 g via ORAL
  Filled 2020-01-14 (×3): qty 1

## 2020-01-14 MED ORDER — SODIUM CHLORIDE 0.9 % IV SOLN
1.0000 g | Freq: Two times a day (BID) | INTRAVENOUS | Status: DC
  Administered 2020-01-14 – 2020-01-16 (×4): 1 g via INTRAVENOUS
  Filled 2020-01-14: qty 0.17
  Filled 2020-01-14 (×2): qty 1
  Filled 2020-01-14: qty 0.17
  Filled 2020-01-14: qty 1
  Filled 2020-01-14: qty 0.17

## 2020-01-14 NOTE — Evaluation (Signed)
Physical Therapy Evaluation Patient Details Name: Margaret [name] MRN: [id number] DOB: [birth year] Today's Date: 01/14/2020   History of Present Illness  70 yo female with PMH DMII, HTN, hypothyroidism, CKD 3 who presented to the ER with lower abdominal pain and left-sided flank pain.  She had ongoing nausea and vomiting attributed to her pain, taken to the OR shortly after admission and underwent cystoscopy and left ureteral stent placement.  continuing tx d/t Acute renal failure superimposed on stage 3a chronic kidney disease  Clinical Impression  Pt admitted with above diagnosis.  Pt with limited cooperation, stating she can't get up because she is "cold". Pt lives alone, has a friend she states can help her at home. Pt states to just add her on the "end of your list" and she will get up later. Pt admits to not mobilizing at all in the last 3 days. Discussed importance of mobility, pt continues to politely decline.  Recommend HHPT at this time however difficult to adequately determine with pt limited mobility efforts today.  Pt currently with functional limitations due to the deficits listed below (see PT Problem List). Pt will benefit from skilled PT to increase their independence and safety with mobility to allow discharge to the venue listed below.       Follow Up Recommendations Home health PT    Equipment Recommendations  Other (comment) (TBD)    Recommendations for Other Services       Precautions / Restrictions Precautions Precautions: Fall Restrictions Weight Bearing Restrictions: No      Mobility  Bed Mobility               General bed mobility comments: moves partially from supine to sit then states she can't bc of being cold. offerred to keep pt under blankets while sitting EOB but pt continued to decline d/t fatigue and being cold    Transfers                 General transfer comment: NT  Ambulation/Gait             General Gait Details:  NT  Stairs            Wheelchair Mobility    Modified Rankin (Stroke Patients Only)       Balance                                             Pertinent Vitals/Pain Pain Assessment: No/denies pain    Home Living Family/patient expects to be discharged to:: Private residence Living Arrangements: Alone Available Help at Discharge: Friend(s) Nicole Kindred) Type of Home: House       Home Layout: One level Home Equipment: None      Prior Function Level of Independence: Independent               Hand Dominance        Extremity/Trunk Assessment   Upper Extremity Assessment Upper Extremity Assessment: Overall WFL for tasks assessed    Lower Extremity Assessment Lower Extremity Assessment: Overall WFL for tasks assessed       Communication   Communication: No difficulties;Other (comment) (keeps eyes closed)  Cognition Arousal/Alertness:  (see below) Behavior During Therapy: Flat affect Overall Cognitive Status: Within Functional Limits for tasks assessed  General Comments: pt is alert, answers all questions and responds appropriately however keeps eyes closed except briefly opening x2. pt repeated states she is "cold" and "everybody comes at once", no other staff in room which pt acknowledges      General Comments      Exercises General Exercises - Lower Extremity Ankle Circles/Pumps: AROM;Both;5 reps   Assessment/Plan    PT Assessment    PT Problem List         PT Treatment Interventions      PT Goals (Current goals can be found in the Care Plan section)  Acute Rehab PT Goals Patient Stated Goal: to get warm PT Goal Formulation: With patient Time For Goal Achievement: 01/28/20 Potential to Achieve Goals: Good    Frequency     Barriers to discharge        Co-evaluation               AM-PAC PT "6 Clicks" Mobility  Outcome Measure Help needed turning from your back  to your side while in a flat bed without using bedrails?: A Little Help needed moving from lying on your back to sitting on the side of a flat bed without using bedrails?: A Little Help needed moving to and from a bed to a chair (including a wheelchair)?: A Lot Help needed standing up from a chair using your arms (e.g., wheelchair or bedside chair)?: A Lot Help needed to walk in hospital room?: A Lot Help needed climbing 3-5 steps with a railing? : A Lot 6 Click Score: 14    End of Session   Activity Tolerance: Patient limited by fatigue Patient left: in bed;with call bell/phone within reach;with bed alarm set   PT Visit Diagnosis: Other abnormalities of gait and mobility (R26.89)    Time: 1884-1660 PT Time Calculation (min) (ACUTE ONLY): 10 min   Charges:   PT Evaluation $PT Eval Low Complexity: 1 Low          Shaheen Mende, PT  Acute Rehab Dept ([place]) [phone] Pager [phone]  01/14/2020   [facility] 01/14/2020, 11:30 AM

## 2020-01-14 NOTE — Progress Notes (Signed)
PROGRESS NOTE    Margaret [name]   EXB:[id number]  DOB: 1950/02/28  DOA: 01/10/2020     3  PCP: Jonathon Bellows, PA-C  CC: abdominal pain, N/V  Hospital Course: [name] is a 70 yo female with PMH DMII, HTN, hypothyroidism, CKD 3 who presented to the ER with lower abdominal pain and left-sided flank pain. She had ongoing nausea and vomiting attributed to her pain as well. She has no known prior history of nephrolithiasis or renal dysfunction.  She underwent work-up in the ER including CT abdomen/pelvis which was notable for a 7 mm obstructing left ureteral stone. There were also complex bilateral renal cysts noted. Urinalysis was consistent with infection. She was started on Rocephin and urology was consulted. Chemistries were also consistent with renal failure due to outlet obstruction from ureterolithiasis.  She was taken to the OR shortly after admission and underwent cystoscopy and left ureteral stent placement.  After stent placement, renal function started to slowly improve as well as urine output.  With further maturation of cultures, her urine culture grew Morganella and blood culture grew Proteus.  Her case was discussed with ID and antibiotics were broadened to meropenem until further culture results.   Interval History:  No events overnight.  More awake in bed this morning however still endorsing complaints of ongoing weakness.  Again reiterated, multifactorial but with underlying bacteremia, this can significantly contribute.  She worked well with physical therapy with recommendations for home health PT at time of discharge.  Old records reviewed in assessment of this patient  ROS: Constitutional: negative for chills and fevers, Respiratory: negative for cough, Cardiovascular: negative for chest pain and Gastrointestinal: negative for abdominal pain  Assessment & Plan: * Acute renal failure superimposed on stage 3a chronic kidney disease ([place]) - patient  actually follows with [facility]; nephrology note reviewed in careeverywhere; recently seen on 11/07/2019 - baseline creat 1.4 - 1.5 -Creatinine 6.42 on admission, BUN 116. CT abdomen/pelvis showed left ureterolithiasis with obstruction. Patient underwent urgent stent placement with urology on admission -Renal function has been slowly improving as well as urine output -Continue fluids and trending renal function  Complicated UTI (urinary tract infection) -Due to obstructed ureteral stone on the left side -Urine culture has now speciated to Morganella, resistant to cefazolin (no other cephalosporin sensitivities) -Discontinued Rocephin and start meropenem on 1/8  Ureterolithiasis - 7 mm stone obstructing left ureter on CT abd/pelvis on admission - s/p left ureteral stent by urology on admission; greatly appreciate assistance - outpatient follow up with urology   Bacteremia - see complicated UTI - blood culture noted with Proteus, pansensitive - continue on meropenem; will discuss again with ID on 1/10  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[place]) -Care everywhere note reviewed -Continue Tums, calcitriol, calcium/vitamin D supplement -Continue trending CMP  Hypokalemia -Replete and recheck as needed  Hypocalcemia -Replete and recheck as needed -See hypoparathyroidism  Metabolic acidosis, increased anion gap-resolved as of 01/14/2020 - likely due to uremia from obstruction - starting to improve with relief of obstruction and IVF   Antimicrobials: Rocephin 01/10/2020>> 01/12/2020 Meropenem 01/13/2020 >> current  DVT prophylaxis: HSQ Code Status: Full Family Communication: none present Disposition Plan: Status is: Inpatient  Remains inpatient appropriate because:Ongoing diagnostic testing needed not appropriate for outpatient work up, IV treatments appropriate due to intensity of illness or inability to take PO and Inpatient level of care appropriate due to severity of illness   Dispo: The  patient is from: Home  Anticipated d/c is to: Home with HHPT              Anticipated d/c date is: 3 days              Patient currently is not medically stable to d/c.   Objective: Blood pressure (!) 169/94, pulse 73, temperature 98.9 F (37.2 C), temperature source Oral, resp. rate 20, height 5\' 1"  (1.549 m), weight 98.9 kg, SpO2 98 %.  Examination: General appearance: More awake than yesterday but still lethargic.  Energy improving daily Head: Normocephalic, without obvious abnormality, atraumatic Eyes: EOMI Lungs: clear to auscultation bilaterally Heart: regular rate and rhythm and S1, S2 normal Abdomen: normal findings: bowel sounds normal and soft, non-tender Extremities: No edema Skin: mobility and turgor normal Neurologic: Grossly normal  Consultants:   Urology  Procedures:   Left ureteral stent placement, 01/11/2020  Data Reviewed: I have personally reviewed following labs and imaging studies Results for orders placed or performed during the hospital encounter of 01/10/20 (from the past 24 hour(s))  CBC with Differential/Platelet     Status: Abnormal   Collection Time: 01/14/20  6:56 AM  Result Value Ref Range   WBC 11.4 (H) 4.0 - 10.5 K/uL   RBC 4.09 3.87 - 5.11 MIL/uL   Hemoglobin 11.3 (L) 12.0 - 15.0 g/dL   HCT 34.0 (L) 36.0 - 46.0 %   MCV 83.1 80.0 - 100.0 fL   MCH 27.6 26.0 - 34.0 pg   MCHC 33.2 30.0 - 36.0 g/dL   RDW 16.2 (H) 11.5 - 15.5 %   Platelets 263 150 - 400 K/uL   nRBC 0.0 0.0 - 0.2 %   Neutrophils Relative % 66 %   Neutro Abs 7.7 1.7 - 7.7 K/uL   Lymphocytes Relative 13 %   Lymphs Abs 1.5 0.7 - 4.0 K/uL   Monocytes Relative 6 %   Monocytes Absolute 0.7 0.1 - 1.0 K/uL   Eosinophils Relative 1 %   Eosinophils Absolute 0.1 0.0 - 0.5 K/uL   Basophils Relative 0 %   Basophils Absolute 0.0 0.0 - 0.1 K/uL   WBC Morphology MILD LEFT SHIFT (1-5% METAS, OCC MYELO, OCC BANDS)    Immature Granulocytes 14 %   Abs Immature Granulocytes 1.54  (H) 0.00 - 0.07 K/uL   Tear Drop Cells PRESENT   Comprehensive metabolic panel     Status: Abnormal   Collection Time: 01/14/20  9:08 AM  Result Value Ref Range   Sodium 135 135 - 145 mmol/L   Potassium 3.8 3.5 - 5.1 mmol/L   Chloride 102 98 - 111 mmol/L   CO2 22 22 - 32 mmol/L   Glucose, Bld 166 (H) 70 - 99 mg/dL   BUN 72 (H) 8 - 23 mg/dL   Creatinine, Ser 2.46 (H) 0.44 - 1.00 mg/dL   Calcium 8.0 (L) 8.9 - 10.3 mg/dL   Total Protein 6.2 (L) 6.5 - 8.1 g/dL   Albumin 2.4 (L) 3.5 - 5.0 g/dL   AST 19 15 - 41 U/L   ALT 22 0 - 44 U/L   Alkaline Phosphatase 67 38 - 126 U/L   Total Bilirubin 0.6 0.3 - 1.2 mg/dL   GFR, Estimated 21 (L) >60 mL/min   Anion gap 11 5 - 15  Magnesium     Status: None   Collection Time: 01/14/20  9:08 AM  Result Value Ref Range   Magnesium 1.9 1.7 - 2.4 mg/dL    Recent Results (from the past 240  hour(s))  Resp Panel by RT-PCR (Flu A&B, Covid) Nasopharyngeal Swab     Status: None   Collection Time: 01/10/20  6:50 PM   Specimen: Nasopharyngeal Swab; Nasopharyngeal(NP) swabs in vial transport medium  Result Value Ref Range Status   SARS Coronavirus 2 by RT PCR NEGATIVE NEGATIVE Final    Comment: (NOTE) SARS-CoV-2 target nucleic acids are NOT DETECTED.  The SARS-CoV-2 RNA is generally detectable in upper respiratory specimens during the acute phase of infection. The lowest concentration of SARS-CoV-2 viral copies this assay can detect is 138 copies/mL. A negative result does not preclude SARS-Cov-2 infection and should not be used as the sole basis for treatment or other patient management decisions. A negative result may occur with  improper specimen collection/handling, submission of specimen other than nasopharyngeal swab, presence of viral mutation(s) within the areas targeted by this assay, and inadequate number of viral copies(<138 copies/mL). A negative result must be combined with clinical observations, patient history, and  epidemiological information. The expected result is Negative.  Fact Sheet for Patients:  EntrepreneurPulse.com.au  Fact Sheet for Healthcare Providers:  IncredibleEmployment.be  This test is no t yet approved or cleared by the [place] FDA and  has been authorized for detection and/or diagnosis of SARS-CoV-2 by FDA under an Emergency Use Authorization (EUA). This EUA will remain  in effect (meaning this test can be used) for the duration of the COVID-19 declaration under Section 564(b)(1) of the Act, 21 U.S.C.section 360bbb-3(b)(1), unless the authorization is terminated  or revoked sooner.       Influenza A by PCR NEGATIVE NEGATIVE Final   Influenza B by PCR NEGATIVE NEGATIVE Final    Comment: (NOTE) The Xpert Xpress SARS-CoV-2/FLU/RSV plus assay is intended as an aid in the diagnosis of influenza from Nasopharyngeal swab specimens and should not be used as a sole basis for treatment. Nasal washings and aspirates are unacceptable for Xpert Xpress SARS-CoV-2/FLU/RSV testing.  Fact Sheet for Patients: EntrepreneurPulse.com.au  Fact Sheet for Healthcare Providers: IncredibleEmployment.be  This test is not yet approved or cleared by the [place] FDA and has been authorized for detection and/or diagnosis of SARS-CoV-2 by FDA under an Emergency Use Authorization (EUA). This EUA will remain in effect (meaning this test can be used) for the duration of the COVID-19 declaration under Section 564(b)(1) of the Act, 21 U.S.C. section 360bbb-3(b)(1), unless the authorization is terminated or revoked.  Performed at [facility], [address] [place], [place] [address]   Culture, Urine     Status: Abnormal   Collection Time: 01/10/20  7:09 PM   Specimen: Urine, Random  Result Value Ref Range Status   Specimen Description   Final    URINE, RANDOM Performed at [facility], [address] [place], [place] [address]    Special Requests   Final    NONE Performed at [facility], [address] [place], [place] [address]    Culture >=100,000 COLONIES/mL [facility] MORGANII (A)  Final   Report Status 01/13/2020 FINAL  Final   Organism ID, Bacteria MORGANELLA MORGANII (A)  Final      Susceptibility   Morganella morganii - MIC*    AMPICILLIN >=32 RESISTANT Resistant     CEFAZOLIN >=64 RESISTANT Resistant     CIPROFLOXACIN <=0.25 SENSITIVE Sensitive     GENTAMICIN <=1 SENSITIVE Sensitive     IMIPENEM 2 SENSITIVE Sensitive     NITROFURANTOIN 128 RESISTANT Resistant  TRIMETH/SULFA <=20 SENSITIVE Sensitive     AMPICILLIN/SULBACTAM 16 INTERMEDIATE Intermediate     PIP/TAZO <=4 SENSITIVE Sensitive     * >=100,000 COLONIES/mL MORGANELLA MORGANII  Blood culture (routine x 2)     Status: Abnormal   Collection Time: 01/10/20  8:45 PM   Specimen: BLOOD  Result Value Ref Range Status   Specimen Description   Final    BLOOD RIGHT ANTECUBITAL Performed at [facility] [place], [address] [place], [place] [address]    Special Requests   Final    BOTTLES DRAWN AEROBIC AND ANAEROBIC Blood Culture adequate volume   Culture  Setup Time   Final    GRAM NEGATIVE RODS IN BOTH AEROBIC AND ANAEROBIC BOTTLES CRITICAL RESULT CALLED TO, READ BACK BY AND VERIFIED WITH: Anette RiedelHARMD T. GREEN 1426 [id number] FCP Performed at [facility], [address] [place], [place] [address]    Culture PROTEUS MIRABILIS (A)  Final   Report Status 01/13/2020 FINAL  Final   Organism ID, Bacteria PROTEUS MIRABILIS  Final      Susceptibility   Proteus mirabilis - MIC*    AMPICILLIN <=2 SENSITIVE Sensitive     CEFAZOLIN <=4 SENSITIVE Sensitive     CEFEPIME <=0.12 SENSITIVE Sensitive     CEFTAZIDIME <=1 SENSITIVE Sensitive     CEFTRIAXONE <=0.25 SENSITIVE Sensitive     CIPROFLOXACIN <=0.25 SENSITIVE Sensitive     GENTAMICIN <=1 SENSITIVE Sensitive     IMIPENEM 2 SENSITIVE  Sensitive     TRIMETH/SULFA <=20 SENSITIVE Sensitive     AMPICILLIN/SULBACTAM <=2 SENSITIVE Sensitive     PIP/TAZO <=4 SENSITIVE Sensitive     * PROTEUS MIRABILIS  Blood Culture ID Panel (Reflexed)     Status: Abnormal   Collection Time: 01/10/20  8:45 PM  Result Value Ref Range Status   Enterococcus faecalis NOT DETECTED NOT DETECTED Final   Enterococcus Faecium NOT DETECTED NOT DETECTED Final   Listeria monocytogenes NOT DETECTED NOT DETECTED Final   Staphylococcus species NOT DETECTED NOT DETECTED Final   Staphylococcus aureus (BCID) NOT DETECTED NOT DETECTED Final   Staphylococcus epidermidis NOT DETECTED NOT DETECTED Final   Staphylococcus lugdunensis NOT DETECTED NOT DETECTED Final   Streptococcus species NOT DETECTED NOT DETECTED Final   Streptococcus agalactiae NOT DETECTED NOT DETECTED Final   Streptococcus pneumoniae NOT DETECTED NOT DETECTED Final   Streptococcus pyogenes NOT DETECTED NOT DETECTED Final   A.calcoaceticus-baumannii NOT DETECTED NOT DETECTED Final   Bacteroides fragilis NOT DETECTED NOT DETECTED Final   Enterobacterales DETECTED (A) NOT DETECTED Final    Comment: Enterobacterales represent a large order of gram negative bacteria, not a single organism. CRITICAL RESULT CALLED TO, READ BACK BY AND VERIFIED WITH: PHARMD T. GREEN 1426 [id number] FCP    Enterobacter cloacae complex NOT DETECTED NOT DETECTED Final   Escherichia coli NOT DETECTED NOT DETECTED Final   Klebsiella aerogenes NOT DETECTED NOT DETECTED Final   Klebsiella oxytoca NOT DETECTED NOT DETECTED Final   Klebsiella pneumoniae NOT DETECTED NOT DETECTED Final   Proteus species DETECTED (A) NOT DETECTED Final    Comment: CRITICAL RESULT CALLED TO, READ BACK BY AND VERIFIED WITH: PHARMD T. GREEN 1426 [id number] FCP    Salmonella species NOT DETECTED NOT DETECTED Final   Serratia marcescens NOT DETECTED NOT DETECTED Final   Haemophilus influenzae NOT DETECTED NOT DETECTED Final   Neisseria meningitidis  NOT DETECTED NOT DETECTED Final   Pseudomonas aeruginosa NOT DETECTED NOT DETECTED Final   Stenotrophomonas maltophilia  NOT DETECTED NOT DETECTED Final   Candida albicans NOT DETECTED NOT DETECTED Final   Candida auris NOT DETECTED NOT DETECTED Final   Candida glabrata NOT DETECTED NOT DETECTED Final   Candida krusei NOT DETECTED NOT DETECTED Final   Candida parapsilosis NOT DETECTED NOT DETECTED Final   Candida tropicalis NOT DETECTED NOT DETECTED Final   Cryptococcus neoformans/gattii NOT DETECTED NOT DETECTED Final   CTX-M ESBL NOT DETECTED NOT DETECTED Final   Carbapenem resistance IMP NOT DETECTED NOT DETECTED Final   Carbapenem resistance KPC NOT DETECTED NOT DETECTED Final   Carbapenem resistance NDM NOT DETECTED NOT DETECTED Final   Carbapenem resist OXA 48 LIKE NOT DETECTED NOT DETECTED Final   Carbapenem resistance VIM NOT DETECTED NOT DETECTED Final    Comment: Performed at [facility], [address] [place], [place] [address]     Radiology Studies: No results found. DG C-Arm 1-60 Min-No Report  Final Result    CT Abdomen Pelvis Wo Contrast  Final Result      Scheduled Meds: . aspirin  81 mg Oral Daily  . atorvastatin  20 mg Oral Daily  . calcitRIOL  0.5 mcg Oral BID  . calcium carbonate  1 tablet Oral TID WC  . calcium-vitamin D  1 tablet Oral BID  . Chlorhexidine Gluconate Cloth  6 each Topical Daily  . heparin  5,000 Units Subcutaneous Q8H  . levothyroxine  175 mcg Oral QAC breakfast  . polyethylene glycol  17 g Oral Daily  . senna-docusate  1 tablet Oral BID   PRN Meds: acetaminophen **OR** acetaminophen Continuous Infusions: . lactated ringers 100 mL/hr at 01/13/20 1329  . meropenem (MERREM) IV       LOS: 3 days  Time spent: Greater than 50% of the 35 minute visit was spent in counseling/coordination of care for the patient as laid out in the A&P.   Dwyane Dee, MD [facility] 01/14/2020, 2:29 PM

## 2020-01-15 DIAGNOSIS — R7881 Bacteremia: Secondary | ICD-10-CM | POA: Diagnosis not present

## 2020-01-15 DIAGNOSIS — N179 Acute kidney failure, unspecified: Secondary | ICD-10-CM | POA: Diagnosis not present

## 2020-01-15 DIAGNOSIS — N390 Urinary tract infection, site not specified: Secondary | ICD-10-CM | POA: Diagnosis not present

## 2020-01-15 LAB — COMPREHENSIVE METABOLIC PANEL
ALT: 27 U/L (ref 0–44)
AST: 26 U/L (ref 15–41)
Albumin: 2.3 g/dL — ABNORMAL LOW (ref 3.5–5.0)
Alkaline Phosphatase: 59 U/L (ref 38–126)
Anion gap: 9 (ref 5–15)
BUN: 51 mg/dL — ABNORMAL HIGH (ref 8–23)
CO2: 26 mmol/L (ref 22–32)
Calcium: 8.7 mg/dL — ABNORMAL LOW (ref 8.9–10.3)
Chloride: 104 mmol/L (ref 98–111)
Creatinine, Ser: 1.8 mg/dL — ABNORMAL HIGH (ref 0.44–1.00)
GFR, Estimated: 30 mL/min — ABNORMAL LOW (ref >60)
Glucose, Bld: 95 mg/dL (ref 70–99)
Potassium: 4.5 mmol/L (ref 3.5–5.1)
Sodium: 139 mmol/L (ref 135–145)
Total Bilirubin: 0.3 mg/dL (ref 0.3–1.2)
Total Protein: 5.9 g/dL — ABNORMAL LOW (ref 6.5–8.1)

## 2020-01-15 LAB — CBC WITH DIFFERENTIAL/PLATELET
Abs Immature Granulocytes: 0.6 10*3/uL — ABNORMAL HIGH (ref 0.00–0.07)
Basophils Absolute: 0 10*3/uL (ref 0.0–0.1)
Basophils Relative: 0 %
Eosinophils Absolute: 0.2 10*3/uL (ref 0.0–0.5)
Eosinophils Relative: 2 %
HCT: 30 % — ABNORMAL LOW (ref 36.0–46.0)
Hemoglobin: 9.8 g/dL — ABNORMAL LOW (ref 12.0–15.0)
Lymphocytes Relative: 16 %
Lymphs Abs: 1.7 10*3/uL (ref 0.7–4.0)
MCH: 27.1 pg (ref 26.0–34.0)
MCHC: 32.7 g/dL (ref 30.0–36.0)
MCV: 83.1 fL (ref 80.0–100.0)
Metamyelocytes Relative: 3 %
Monocytes Absolute: 0.3 10*3/uL (ref 0.1–1.0)
Monocytes Relative: 3 %
Myelocytes: 3 %
Neutro Abs: 7.9 10*3/uL — ABNORMAL HIGH (ref 1.7–7.7)
Neutrophils Relative %: 73 %
Platelets: 282 10*3/uL (ref 150–400)
RBC: 3.61 MIL/uL — ABNORMAL LOW (ref 3.87–5.11)
RDW: 16.1 % — ABNORMAL HIGH (ref 11.5–15.5)
WBC: 10.8 10*3/uL — ABNORMAL HIGH (ref 4.0–10.5)
nRBC: 0 % (ref 0.0–0.2)

## 2020-01-15 LAB — MAGNESIUM: Magnesium: 1.5 mg/dL — ABNORMAL LOW (ref 1.7–2.4)

## 2020-01-15 MED ORDER — MAGNESIUM CITRATE PO SOLN
1.0000 | Freq: Once | ORAL | Status: AC
  Administered 2020-01-15: 1 via ORAL
  Filled 2020-01-15: qty 296

## 2020-01-15 MED ORDER — CARVEDILOL 25 MG PO TABS
25.0000 mg | ORAL_TABLET | Freq: Two times a day (BID) | ORAL | Status: DC
  Administered 2020-01-15 – 2020-01-16 (×4): 25 mg via ORAL
  Filled 2020-01-15 (×4): qty 1

## 2020-01-15 MED ORDER — HYDRALAZINE HCL 50 MG PO TABS
100.0000 mg | ORAL_TABLET | Freq: Three times a day (TID) | ORAL | Status: DC
  Administered 2020-01-15 – 2020-01-16 (×5): 100 mg via ORAL
  Filled 2020-01-15 (×5): qty 2

## 2020-01-15 MED ORDER — AMLODIPINE BESYLATE 10 MG PO TABS
10.0000 mg | ORAL_TABLET | Freq: Every day | ORAL | Status: DC
  Administered 2020-01-15 – 2020-01-16 (×2): 10 mg via ORAL
  Filled 2020-01-15 (×2): qty 1

## 2020-01-15 MED ORDER — CALCITRIOL 0.25 MCG PO CAPS
0.2500 ug | ORAL_CAPSULE | Freq: Two times a day (BID) | ORAL | Status: DC
  Administered 2020-01-15 – 2020-01-16 (×3): 0.25 ug via ORAL
  Filled 2020-01-15 (×4): qty 1

## 2020-01-15 MED ORDER — MAGNESIUM OXIDE 400 (241.3 MG) MG PO TABS
800.0000 mg | ORAL_TABLET | Freq: Once | ORAL | Status: AC
  Administered 2020-01-15: 800 mg via ORAL
  Filled 2020-01-15: qty 2

## 2020-01-15 NOTE — Care Management Important Message (Signed)
Important Message  Patient Details IM Letter given to the Patient. Name: Margaret [name] MRN: [id number] Date of Birth: 06/30/1950   Medicare Important Message Given:  Yes     Kerin Salen 01/15/2020, 12:43 PM

## 2020-01-15 NOTE — Progress Notes (Signed)
Patient ID: Margaret [name], female   DOB: 06/18/1950, 70 y.o.   MRN: [id number]  4 Days Post-Op Subjective: Pt feeling better.  Eating now and gaining strength.  Objective: Vital signs in last 24 hours: Temp:  [98.6 F (37 C)-99.5 F (37.5 C)] 98.9 F (37.2 C) (01/10 1208) Pulse Rate:  [69-79] 70 (01/10 1208) Resp:  [16-18] 16 (01/10 1208) BP: (184-195)/(91-114) 186/114 (01/10 1208) SpO2:  [95 %-100 %] 100 % (01/10 1208)  Intake/Output from previous day: 01/09 0701 - 01/10 0700 In: 3139.2 [P.O.:720; I.V.:2319.2; IV Piggyback:100] Out: 2575 [Urine:2575] Intake/Output this shift: Total I/O In: 240 [P.O.:240] Out: 900 [Urine:900]  Physical Exam:  General: Alert and oriented   Lab Results: Recent Labs    01/13/20 0621 01/14/20 0656 01/15/20 0531  HGB 10.0* 11.3* 9.8*  HCT 30.1* 34.0* 30.0*   CBC Latest Ref Rng & Units 01/15/2020 01/14/2020 01/13/2020  WBC 4.0 - 10.5 K/uL 10.8(H) 11.4(H) 11.3(H)  Hemoglobin 12.0 - 15.0 g/dL 9.8(L) 11.3(L) 10.0(L)  Hematocrit 36.0 - 46.0 % 30.0(L) 34.0(L) 30.1(L)  Platelets 150 - 400 K/uL 282 263 209     BMET Recent Labs    01/14/20 0908 01/15/20 0531  NA 135 139  K 3.8 4.5  CL 102 104  CO2 22 26  GLUCOSE 166* 95  BUN 72* 51*  CREATININE 2.46* 1.80*  CALCIUM 8.0* 8.7*     Studies/Results: No results found.  Assessment/Plan: 1) Left ureteral stone: Will arrange outpatient follow up for further treatment once she is recovered and her performance status is optimized.  I will set her up for outpatient follow up in about 2 weeks.  2) UTI/bacteremia: Interestingly, urine and blood cultures were not the same.  However, both organisms are reasonably from a GU source and both sensitive to ciprofloxacin.  Plan is for patient to be discharged with ciprofloxacin per ID recommendations.  Will recheck urine after treatment in our clinic.  3) AKI: Improving and close to baseline now.  Will recheck renal function at follow up.  Will sign  off.  Please call if further questions.    LOS: 4 days   Dutch Gray 01/15/2020, 6:24 PM

## 2020-01-15 NOTE — Progress Notes (Signed)
Physical Therapy Treatment Patient Details Name: Margaret [name] MRN: [id number] DOB: Apr 19, 1950 Today's Date: 01/15/2020    History of Present Illness 70 yo female with PMH DMII, HTN, hypothyroidism, CKD 3 who presented to the ER with lower abdominal pain and left-sided flank pain.  She had ongoing nausea and vomiting attributed to her pain, taken to the OR shortly after admission and underwent cystoscopy and left ureteral stent placement.  continuing tx d/t Acute renal failure superimposed on stage 3a chronic kidney disease    PT Comments    Pt agreeable to short distance amb in room and sitting up in recliner.  Pt feels she will be able to go home and manage without difficulty. Has a cane to use If needed. Continue to follow   Follow Up Recommendations  Home health PT     Equipment Recommendations  None recommended by PT    Recommendations for Other Services       Precautions / Restrictions Precautions Precautions: Fall Restrictions Weight Bearing Restrictions: No    Mobility  Bed Mobility Overal bed mobility: Needs Assistance Bed Mobility: Supine to Sit     Supine to sit: HOB elevated;Min assist     General bed mobility comments: incr time and effort, assist to elevate trunk  Transfers Overall transfer level: Needs assistance Equipment used: None Transfers: Sit to/from Stand Sit to Stand: Min guard;Supervision         General transfer comment: for safety  Ambulation/Gait Ambulation/Gait assistance: Min assist;Min guard Gait Distance (Feet): 6 Feet Assistive device: 1 person hand held assist;None Gait Pattern/deviations: Step-through pattern     General Gait Details: pt attempts to hold on to bed rail, reaches for chair arm ( states she does this at home if needed). declines useof RW or incr gait distance   Stairs             Wheelchair Mobility    Modified Rankin (Stroke Patients Only)       Balance                                             Cognition Arousal/Alertness: Awake/alert Behavior During Therapy: WFL for tasks assessed/performed Overall Cognitive Status: Within Functional Limits for tasks assessed                                        Exercises General Exercises - Lower Extremity Ankle Circles/Pumps: AROM;Both;10 reps    General Comments        Pertinent Vitals/Pain Pain Assessment: No/denies pain    Home Living                      Prior Function            PT Goals (current goals can now be found in the care plan section) Acute Rehab PT Goals Patient Stated Goal: to get warm PT Goal Formulation: With patient Time For Goal Achievement: 01/28/20 Potential to Achieve Goals: Good Progress towards PT goals: Progressing toward goals    Frequency    Min 3X/week      PT Plan Current plan remains appropriate    Co-evaluation              AM-PAC PT "6 Clicks" Mobility   Outcome Measure  Help needed turning from your back to your side while in a flat bed without using bedrails?: A Little Help needed moving from lying on your back to sitting on the side of a flat bed without using bedrails?: A Little Help needed moving to and from a bed to a chair (including a wheelchair)?: A Little Help needed standing up from a chair using your arms (e.g., wheelchair or bedside chair)?: A Little Help needed to walk in hospital room?: A Little Help needed climbing 3-5 steps with a railing? : A Little 6 Click Score: 18    End of Session Equipment Utilized During Treatment: Gait belt Activity Tolerance: Patient tolerated treatment well;Patient limited by fatigue Patient left: in chair;with call bell/phone within reach;with chair alarm set   PT Visit Diagnosis: Other abnormalities of gait and mobility (R26.89)     Time: 5027-7412 PT Time Calculation (min) (ACUTE ONLY): 15 min  Charges:  $Gait Training: 8-22 mins                     Baxter Flattery,  PT  Acute Rehab Dept ([place]) [phone] Pager [phone]  01/15/2020    [facility] 01/15/2020, 3:00 PM

## 2020-01-15 NOTE — Progress Notes (Signed)
PROGRESS NOTE    Margaret [name]   [id number]  DOB: Apr 28, 1950  DOA: 01/10/2020     4  PCP: Margaret Bellows, PA-C  CC: abdominal pain, N/V  Hospital Course: Margaret [name] is a 70 yo female with PMH DMII, HTN, hypothyroidism, CKD 3 who presented to the ER with lower abdominal pain and left-sided flank pain. She had ongoing nausea and vomiting attributed to her pain as well. She has no known prior history of nephrolithiasis or renal dysfunction.  She underwent work-up in the ER including CT abdomen/pelvis which was notable for a 7 mm obstructing left ureteral stone. There were also complex bilateral renal cysts noted. Urinalysis was consistent with infection. She was started on Rocephin and urology was consulted. Chemistries were also consistent with renal failure due to outlet obstruction from ureterolithiasis.  She was taken to the OR shortly after admission and underwent cystoscopy and left ureteral stent placement.  After stent placement, renal function started to slowly improve as well as urine output.  With further maturation of cultures, her urine culture grew Morganella and blood culture grew Proteus.  Her case was discussed with ID and antibiotics were broadened to meropenem until further culture results. Case again discussed on 1/10 with ID. Plan will be to continue meropenem during hospitalization and discharge on Cipro to complete a total of 10-day course.   Interval History:  No events overnight.  Energy continues to improve daily.  Still overall very weak and deconditioned.  PT recommending home health at time of discharge when that comes.  Appetite is also improving each day.  Old records reviewed in assessment of this patient  ROS: Constitutional: negative for chills and fevers, Respiratory: negative for cough, Cardiovascular: negative for chest pain and Gastrointestinal: negative for abdominal pain  Assessment & Plan: * Acute renal failure superimposed on  stage 3a chronic kidney disease ([place]) - patient actually follows with [facility]; nephrology note reviewed in careeverywhere; recently seen on 11/07/2019 - baseline creat 1.4 - 1.5 -Creatinine 6.42 on admission, BUN 116. CT abdomen/pelvis showed left ureterolithiasis with obstruction. Patient underwent urgent stent placement with urology on admission -Renal function has been improving as well as urine output; creatinine almost back to baseline -Continue fluids and trending renal function  Complicated UTI (urinary tract infection) -Due to obstructed ureteral stone on the left side -Urine culture has now speciated to Morganella, resistant to cefazolin (no other cephalosporin sensitivities) -Discontinued Rocephin and started meropenem on 1/8 - see bacteremia  Ureterolithiasis - 7 mm stone obstructing left ureter on CT abd/pelvis on admission - s/p left ureteral stent by urology on admission; greatly appreciate assistance - outpatient follow up with urology for definitive stone management  Bacteremia -Urine culture with Morganella - blood culture noted with Proteus, pansensitive - continue on meropenem while hospitalized.  Discussed with ID on 01/15/2020.  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[place]) -Care everywhere note reviewed -Continue Tums, calcitriol, calcium/vitamin D supplement -Continue trending CMP - decrease calcitriol to 0.25 on 1/10 b/c corrected Ca starting to trend up  Hypokalemia -Replete and recheck as needed  Hypocalcemia -Replete and recheck as needed -See hypoparathyroidism  Metabolic acidosis, increased anion gap-resolved as of 01/14/2020 - likely due to uremia from obstruction - starting to improve with relief of obstruction and IVF   Antimicrobials: Rocephin 01/10/2020>> 01/12/2020 Meropenem 01/13/2020 >> current  DVT prophylaxis: HSQ Code Status: Full Family Communication: none  present Disposition Plan:  Status is: Inpatient  Remains inpatient appropriate because:Ongoing diagnostic testing needed not appropriate for outpatient work up, IV treatments appropriate due to intensity of illness or inability to take PO and Inpatient level of care appropriate due to severity of illness   Dispo: The patient is from: Home              Anticipated d/c is to: Home with HHPT              Anticipated d/c date is: 2 days, possibly Wednesday              Patient currently is not medically stable to d/c.   Objective: Blood pressure (!) 186/114, pulse 70, temperature 98.9 F (37.2 C), resp. rate 16, height 5\' 1"  (1.549 m), weight 98.9 kg, SpO2 100 %.  Examination: General appearance: More awake than yesterday but still lethargic.  Energy improving daily Head: Normocephalic, without obvious abnormality, atraumatic Eyes: EOMI Lungs: clear to auscultation bilaterally Heart: regular rate and rhythm and S1, S2 normal Abdomen: normal findings: bowel sounds normal and soft, non-tender Extremities: No edema Skin: mobility and turgor normal Neurologic: Grossly normal  Consultants:   Urology  Procedures:   Left ureteral stent placement, 01/11/2020  Data Reviewed: I have personally reviewed following labs and imaging studies Results for orders placed or performed during the hospital encounter of 01/10/20 (from the past 24 hour(s))  Magnesium     Status: Abnormal   Collection Time: 01/15/20  5:31 AM  Result Value Ref Range   Magnesium 1.5 (L) 1.7 - 2.4 mg/dL  Comprehensive metabolic panel     Status: Abnormal   Collection Time: 01/15/20  5:31 AM  Result Value Ref Range   Sodium 139 135 - 145 mmol/L   Potassium 4.5 3.5 - 5.1 mmol/L   Chloride 104 98 - 111 mmol/L   CO2 26 22 - 32 mmol/L   Glucose, Bld 95 70 - 99 mg/dL   BUN 51 (H) 8 - 23 mg/dL   Creatinine, Ser 1.80 (H) 0.44 - 1.00 mg/dL   Calcium 8.7 (L) 8.9 - 10.3 mg/dL   Total Protein 5.9 (L) 6.5 - 8.1 g/dL   Albumin  2.3 (L) 3.5 - 5.0 g/dL   AST 26 15 - 41 U/L   ALT 27 0 - 44 U/L   Alkaline Phosphatase 59 38 - 126 U/L   Total Bilirubin 0.3 0.3 - 1.2 mg/dL   GFR, Estimated 30 (L) >60 mL/min   Anion gap 9 5 - 15  CBC with Differential/Platelet     Status: Abnormal   Collection Time: 01/15/20  5:31 AM  Result Value Ref Range   WBC 10.8 (H) 4.0 - 10.5 K/uL   RBC 3.61 (L) 3.87 - 5.11 MIL/uL   Hemoglobin 9.8 (L) 12.0 - 15.0 g/dL   HCT 30.0 (L) 36.0 - 46.0 %   MCV 83.1 80.0 - 100.0 fL   MCH 27.1 26.0 - 34.0 pg   MCHC 32.7 30.0 - 36.0 g/dL   RDW 16.1 (H) 11.5 - 15.5 %   Platelets 282 150 - 400 K/uL   nRBC 0.0 0.0 - 0.2 %   Neutrophils Relative % 73 %   Neutro Abs 7.9 (H) 1.7 - 7.7 K/uL   Lymphocytes Relative 16 %   Lymphs Abs 1.7 0.7 - 4.0 K/uL   Monocytes Relative 3 %   Monocytes Absolute 0.3 0.1 - 1.0 K/uL   Eosinophils Relative 2 %   Eosinophils Absolute 0.2 0.0 - 0.5  K/uL   Basophils Relative 0 %   Basophils Absolute 0.0 0.0 - 0.1 K/uL   WBC Morphology MILD LEFT SHIFT (1-5% METAS, OCC MYELO, OCC BANDS)    Metamyelocytes Relative 3 %   Myelocytes 3 %   Abs Immature Granulocytes 0.60 (H) 0.00 - 0.07 K/uL   Polychromasia PRESENT    Target Cells PRESENT     Recent Results (from the past 240 hour(s))  Resp Panel by RT-PCR (Flu A&B, Covid) Nasopharyngeal Swab     Status: None   Collection Time: 01/10/20  6:50 PM   Specimen: Nasopharyngeal Swab; Nasopharyngeal(NP) swabs in vial transport medium  Result Value Ref Range Status   SARS Coronavirus 2 by RT PCR NEGATIVE NEGATIVE Final    Comment: (NOTE) SARS-CoV-2 target nucleic acids are NOT DETECTED.  The SARS-CoV-2 RNA is generally detectable in upper respiratory specimens during the acute phase of infection. The lowest concentration of SARS-CoV-2 viral copies this assay can detect is 138 copies/mL. A negative result does not preclude SARS-Cov-2 infection and should not be used as the sole basis for treatment or other patient management  decisions. A negative result may occur with  improper specimen collection/handling, submission of specimen other than nasopharyngeal swab, presence of viral mutation(s) within the areas targeted by this assay, and inadequate number of viral copies(<138 copies/mL). A negative result must be combined with clinical observations, patient history, and epidemiological information. The expected result is Negative.  Fact Sheet for Patients:  EntrepreneurPulse.com.au  Fact Sheet for Healthcare Providers:  IncredibleEmployment.be  This test is no t yet approved or cleared by the [place] FDA and  has been authorized for detection and/or diagnosis of SARS-CoV-2 by FDA under an Emergency Use Authorization (EUA). This EUA will remain  in effect (meaning this test can be used) for the duration of the COVID-19 declaration under Section 564(b)(1) of the Act, 21 U.S.C.section 360bbb-3(b)(1), unless the authorization is terminated  or revoked sooner.       Influenza A by PCR NEGATIVE NEGATIVE Final   Influenza B by PCR NEGATIVE NEGATIVE Final    Comment: (NOTE) The Xpert Xpress SARS-CoV-2/FLU/RSV plus assay is intended as an aid in the diagnosis of influenza from Nasopharyngeal swab specimens and should not be used as a sole basis for treatment. Nasal washings and aspirates are unacceptable for Xpert Xpress SARS-CoV-2/FLU/RSV testing.  Fact Sheet for Patients: EntrepreneurPulse.com.au  Fact Sheet for Healthcare Providers: IncredibleEmployment.be  This test is not yet approved or cleared by the [place] FDA and has been authorized for detection and/or diagnosis of SARS-CoV-2 by FDA under an Emergency Use Authorization (EUA). This EUA will remain in effect (meaning this test can be used) for the duration of the COVID-19 declaration under Section 564(b)(1) of the Act, 21 U.S.C. section 360bbb-3(b)(1), unless the  authorization is terminated or revoked.  Performed at [facility], [address] [place], [place] [address]   Culture, Urine     Status: Abnormal   Collection Time: 01/10/20  7:09 PM   Specimen: Urine, Random  Result Value Ref Range Status   Specimen Description   Final    URINE, RANDOM Performed at [facility], [address] [place], [place] [address]    Special Requests   Final    NONE Performed at [facility], [address] [place], [place] [address]    Culture >=100,000 COLONIES/mL [facility] MORGANII (A)  Final   Report Status 01/13/2020 FINAL  Final  Organism ID, Bacteria MORGANELLA MORGANII (A)  Final      Susceptibility   Morganella morganii - MIC*    AMPICILLIN >=32 RESISTANT Resistant     CEFAZOLIN >=64 RESISTANT Resistant     CIPROFLOXACIN <=0.25 SENSITIVE Sensitive     GENTAMICIN <=1 SENSITIVE Sensitive     IMIPENEM 2 SENSITIVE Sensitive     NITROFURANTOIN 128 RESISTANT Resistant     TRIMETH/SULFA <=20 SENSITIVE Sensitive     AMPICILLIN/SULBACTAM 16 INTERMEDIATE Intermediate     PIP/TAZO <=4 SENSITIVE Sensitive     * >=100,000 COLONIES/mL MORGANELLA MORGANII  Blood culture (routine x 2)     Status: Abnormal   Collection Time: 01/10/20  8:45 PM   Specimen: BLOOD  Result Value Ref Range Status   Specimen Description   Final    BLOOD RIGHT ANTECUBITAL Performed at [facility], [address] [place], [place] [address]    Special Requests   Final    BOTTLES DRAWN AEROBIC AND ANAEROBIC Blood Culture adequate volume   Culture  Setup Time   Final    GRAM NEGATIVE RODS IN BOTH AEROBIC AND ANAEROBIC BOTTLES CRITICAL RESULT CALLED TO, READ BACK BY AND VERIFIED WITH: Murray Hodgkins GREEN 1426 [id number] FCP Performed at [facility], [place] [address], [place], [place] [address]    Culture PROTEUS MIRABILIS (A)  Final   Report Status 01/13/2020 FINAL  Final   Organism ID, Bacteria PROTEUS MIRABILIS  Final       Susceptibility   Proteus mirabilis - MIC*    AMPICILLIN <=2 SENSITIVE Sensitive     CEFAZOLIN <=4 SENSITIVE Sensitive     CEFEPIME <=0.12 SENSITIVE Sensitive     CEFTAZIDIME <=1 SENSITIVE Sensitive     CEFTRIAXONE <=0.25 SENSITIVE Sensitive     CIPROFLOXACIN <=0.25 SENSITIVE Sensitive     GENTAMICIN <=1 SENSITIVE Sensitive     IMIPENEM 2 SENSITIVE Sensitive     TRIMETH/SULFA <=20 SENSITIVE Sensitive     AMPICILLIN/SULBACTAM <=2 SENSITIVE Sensitive     PIP/TAZO <=4 SENSITIVE Sensitive     * PROTEUS MIRABILIS  Blood Culture ID Panel (Reflexed)     Status: Abnormal   Collection Time: 01/10/20  8:45 PM  Result Value Ref Range Status   Enterococcus faecalis NOT DETECTED NOT DETECTED Final   Enterococcus Faecium NOT DETECTED NOT DETECTED Final   Listeria monocytogenes NOT DETECTED NOT DETECTED Final   Staphylococcus species NOT DETECTED NOT DETECTED Final   Staphylococcus aureus (BCID) NOT DETECTED NOT DETECTED Final   Staphylococcus epidermidis NOT DETECTED NOT DETECTED Final   Staphylococcus lugdunensis NOT DETECTED NOT DETECTED Final   Streptococcus species NOT DETECTED NOT DETECTED Final   Streptococcus agalactiae NOT DETECTED NOT DETECTED Final   Streptococcus pneumoniae NOT DETECTED NOT DETECTED Final   Streptococcus pyogenes NOT DETECTED NOT DETECTED Final   A.calcoaceticus-baumannii NOT DETECTED NOT DETECTED Final   Bacteroides fragilis NOT DETECTED NOT DETECTED Final   Enterobacterales DETECTED (A) NOT DETECTED Final    Comment: Enterobacterales represent a large order of gram negative bacteria, not a single organism. CRITICAL RESULT CALLED TO, READ BACK BY AND VERIFIED WITH: PHARMD T. GREEN 1426 [id number] FCP    Enterobacter cloacae complex NOT DETECTED NOT DETECTED Final   Escherichia coli NOT DETECTED NOT DETECTED Final   Klebsiella aerogenes NOT DETECTED NOT DETECTED Final   Klebsiella oxytoca NOT DETECTED NOT DETECTED Final   Klebsiella pneumoniae NOT DETECTED NOT DETECTED  Final   Proteus species DETECTED (A) NOT DETECTED  Final    Comment: CRITICAL RESULT CALLED TO, READ BACK BY AND VERIFIED WITH: PHARMD T. GREEN 1426 [id number] FCP    Salmonella species NOT DETECTED NOT DETECTED Final   Serratia marcescens NOT DETECTED NOT DETECTED Final   Haemophilus influenzae NOT DETECTED NOT DETECTED Final   Neisseria meningitidis NOT DETECTED NOT DETECTED Final   Pseudomonas aeruginosa NOT DETECTED NOT DETECTED Final   Stenotrophomonas maltophilia NOT DETECTED NOT DETECTED Final   Candida albicans NOT DETECTED NOT DETECTED Final   Candida auris NOT DETECTED NOT DETECTED Final   Candida glabrata NOT DETECTED NOT DETECTED Final   Candida krusei NOT DETECTED NOT DETECTED Final   Candida parapsilosis NOT DETECTED NOT DETECTED Final   Candida tropicalis NOT DETECTED NOT DETECTED Final   Cryptococcus neoformans/gattii NOT DETECTED NOT DETECTED Final   CTX-M ESBL NOT DETECTED NOT DETECTED Final   Carbapenem resistance IMP NOT DETECTED NOT DETECTED Final   Carbapenem resistance KPC NOT DETECTED NOT DETECTED Final   Carbapenem resistance NDM NOT DETECTED NOT DETECTED Final   Carbapenem resist OXA 48 LIKE NOT DETECTED NOT DETECTED Final   Carbapenem resistance VIM NOT DETECTED NOT DETECTED Final    Comment: Performed at [facility], [address], [place], [place] [address]     Radiology Studies: No results found. DG C-Arm 1-60 Min-No Report  Final Result    CT Abdomen Pelvis Wo Contrast  Final Result      Scheduled Meds: . amLODipine  10 mg Oral Daily  . aspirin  81 mg Oral Daily  . atorvastatin  20 mg Oral Daily  . calcitRIOL  0.25 mcg Oral BID  . calcium carbonate  1 tablet Oral TID WC  . calcium-vitamin D  1 tablet Oral BID  . carvedilol  25 mg Oral BID WC  . Chlorhexidine Gluconate Cloth  6 each Topical Daily  . heparin  5,000 Units Subcutaneous Q8H  . hydrALAZINE  100 mg Oral Q8H  . levothyroxine  175 mcg Oral QAC breakfast  . polyethylene glycol   17 g Oral Daily  . senna-docusate  1 tablet Oral BID   PRN Meds: acetaminophen **OR** acetaminophen Continuous Infusions: . lactated ringers 100 mL/hr at 01/15/20 1245  . meropenem (MERREM) IV 1 g (01/15/20 0524)     LOS: 4 days  Time spent: Greater than 50% of the 35 minute visit was spent in counseling/coordination of care for the patient as laid out in the A&P.   Dwyane Dee, MD [facility] 01/15/2020, 3:03 PM

## 2020-01-15 NOTE — TOC Initial Note (Signed)
Transition of Care [facility]) - Initial/Assessment Note    Patient Details  Name: Margaret [name] MRN: [id number] Date of Birth: July 18, 1950  Transition of [facility]) CM/SW Contact:    Dessa Phi, RN Phone Number: 01/15/2020, 11:46 AM  Clinical Narrative: Patient plans to d/c home-she pleasantly declines HHPT;states she can do that without HHC. No further CM needs.               Expected Discharge Plan: Home/Self Care Barriers to Discharge:  (decline HHPT)   Patient Goals and CMS Choice Patient states their goals for this hospitalization and ongoing recovery are:: go home CMS Medicare.gov Compare Post Acute Care list provided to:: Patient Choice offered to / list presented to : Patient  Expected Discharge Plan and Services Expected Discharge Plan: Home/Self Care   Discharge Planning Services: CM Consult Post Acute Care Choice:  (decline HHPT) Living arrangements for the past 2 months: Single Family Home                           HH Arranged: Refused HH          Prior Living Arrangements/Services Living arrangements for the past 2 months: Single Family Home Lives with:: Self Patient language and need for interpreter reviewed:: Yes Do you feel safe going back to the place where you live?: Yes      Need for Family Participation in Patient Care: No (Comment) Care giver support system in place?: Yes (comment) Current home services: DME (cane, rw) Criminal Activity/Legal Involvement Pertinent to Current Situation/Hospitalization: No - Comment as needed  Activities of Daily Living Home Assistive Devices/Equipment: None ADL Screening (condition at time of admission) Patient's cognitive ability adequate to safely complete daily activities?: Yes Is the patient deaf or have difficulty hearing?: No Does the patient have difficulty seeing, even when wearing glasses/contacts?: No Does the patient have difficulty concentrating, remembering, or making decisions?: No Patient able  to express need for assistance with ADLs?: Yes Does the patient have difficulty dressing or bathing?: Yes Independently performs ADLs?: Yes (appropriate for developmental age) Does the patient have difficulty walking or climbing stairs?: Yes Weakness of Legs: None Weakness of Arms/Hands: None  Permission Sought/Granted Permission sought to share information with : Case Manager Permission granted to share information with : Yes, Verbal Permission Granted  Share Information with NAME: Case Manager           Emotional Assessment Appearance:: Appears stated age Attitude/Demeanor/Rapport: Gracious Affect (typically observed): Accepting Orientation: : Oriented to Self,Oriented to Place,Oriented to  Time,Oriented to Situation Alcohol / Substance Use: Not Applicable Psych Involvement: No (comment)  Admission diagnosis:  Hypocalcemia [E83.51] Nephrolithiasis [N20.0] ARF (acute renal failure) (HCC) [N17.9] Acute cystitis with hematuria [N30.01] Acute renal injury ([place]) [N17.9] Patient Active Problem List   Diagnosis Date Noted  . 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 Bacteremia 01/12/2020  . Acute renal failure superimposed on stage 3a chronic kidney disease ([place]) 01/11/2020  . Ureterolithiasis 01/11/2020  . Complicated UTI (urinary tract infection) 01/11/2020  . Prolonged QT interval 01/11/2020  . Obesity, Class III, BMI 40-49.9 (morbid obesity) ([place]) 01/11/2020  . ARF (acute renal failure) ([place]) 01/11/2020  . Osteoarthritis of right hip 12/24/2018  . History of total right hip arthroplasty 12/24/2018  . Acute kidney injury superimposed on chronic kidney disease ([place]) 12/24/2018  . UTI (urinary tract infection) 12/24/2018  . Leg swelling 05/29/2016  . Hypokalemia 05/08/2016  . Renal insufficiency 05/08/2016  . Mixed hyperlipidemia  05/07/2016  . Obesity (BMI 30-39.9) 05/07/2016  . Prediabetes 05/07/2016  . Hypertension 05/07/2016  . Postoperative hypothyroidism  08/08/2015  . Hypocalcemia 02/13/2015  . 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[place]) 02/13/2015   PCP:  Jonathon Bellows, PA-C Pharmacy:   [id number] - [place], [place] - [address] AT [address] [place] [address] Phone: [phone] Fax: [phone]     Social Determinants of Health (SDOH) Interventions    Readmission Risk Interventions No flowsheet data found.

## 2020-01-16 DIAGNOSIS — E876 Hypokalemia: Secondary | ICD-10-CM

## 2020-01-16 DIAGNOSIS — R7881 Bacteremia: Secondary | ICD-10-CM | POA: Diagnosis not present

## 2020-01-16 DIAGNOSIS — N390 Urinary tract infection, site not specified: Secondary | ICD-10-CM | POA: Diagnosis not present

## 2020-01-16 DIAGNOSIS — N179 Acute kidney failure, unspecified: Secondary | ICD-10-CM | POA: Diagnosis not present

## 2020-01-16 LAB — COMPREHENSIVE METABOLIC PANEL
ALT: 29 U/L (ref 0–44)
AST: 25 U/L (ref 15–41)
Albumin: 2.3 g/dL — ABNORMAL LOW (ref 3.5–5.0)
Alkaline Phosphatase: 58 U/L (ref 38–126)
Anion gap: 8 (ref 5–15)
BUN: 39 mg/dL — ABNORMAL HIGH (ref 8–23)
CO2: 29 mmol/L (ref 22–32)
Calcium: 9 mg/dL (ref 8.9–10.3)
Chloride: 102 mmol/L (ref 98–111)
Creatinine, Ser: 1.49 mg/dL — ABNORMAL HIGH (ref 0.44–1.00)
GFR, Estimated: 38 mL/min — ABNORMAL LOW (ref >60)
Glucose, Bld: 94 mg/dL (ref 70–99)
Potassium: 4.4 mmol/L (ref 3.5–5.1)
Sodium: 139 mmol/L (ref 135–145)
Total Bilirubin: 0.4 mg/dL (ref 0.3–1.2)
Total Protein: 5.9 g/dL — ABNORMAL LOW (ref 6.5–8.1)

## 2020-01-16 LAB — CBC WITH DIFFERENTIAL/PLATELET
Abs Immature Granulocytes: 0.9 10*3/uL — ABNORMAL HIGH (ref 0.00–0.07)
Basophils Absolute: 0.1 10*3/uL (ref 0.0–0.1)
Basophils Relative: 1 %
Eosinophils Absolute: 0.1 10*3/uL (ref 0.0–0.5)
Eosinophils Relative: 1 %
HCT: 30.1 % — ABNORMAL LOW (ref 36.0–46.0)
Hemoglobin: 9.6 g/dL — ABNORMAL LOW (ref 12.0–15.0)
Immature Granulocytes: 9 %
Lymphocytes Relative: 18 %
Lymphs Abs: 1.8 10*3/uL (ref 0.7–4.0)
MCH: 27.2 pg (ref 26.0–34.0)
MCHC: 31.9 g/dL (ref 30.0–36.0)
MCV: 85.3 fL (ref 80.0–100.0)
Monocytes Absolute: 0.7 10*3/uL (ref 0.1–1.0)
Monocytes Relative: 7 %
Neutro Abs: 6.2 10*3/uL (ref 1.7–7.7)
Neutrophils Relative %: 64 %
Platelets: 282 10*3/uL (ref 150–400)
RBC: 3.53 MIL/uL — ABNORMAL LOW (ref 3.87–5.11)
RDW: 16.2 % — ABNORMAL HIGH (ref 11.5–15.5)
WBC: 9.7 10*3/uL (ref 4.0–10.5)
nRBC: 0 % (ref 0.0–0.2)

## 2020-01-16 LAB — MAGNESIUM: Magnesium: 1.6 mg/dL — ABNORMAL LOW (ref 1.7–2.4)

## 2020-01-16 MED ORDER — CIPROFLOXACIN HCL 250 MG PO TABS: 250.0000 mg | ORAL_TABLET | Freq: Two times a day (BID) | ORAL | 0 refills | Status: AC

## 2020-01-16 MED ORDER — MAGNESIUM OXIDE 400 (241.3 MG) MG PO TABS
400.0000 mg | ORAL_TABLET | Freq: Two times a day (BID) | ORAL | Status: DC
  Administered 2020-01-16: 400 mg via ORAL
  Filled 2020-01-16: qty 1

## 2020-01-16 MED ORDER — MAGNESIUM OXIDE -MG SUPPLEMENT 400 (240 MG) MG PO TABS: 1.0000 | ORAL_TABLET | Freq: Every day | ORAL | 0 refills | Status: DC

## 2020-01-16 MED ORDER — MAGNESIUM SULFATE 2 GM/50ML IV SOLN
2.0000 g | Freq: Once | INTRAVENOUS | Status: AC
  Administered 2020-01-16: 2 g via INTRAVENOUS
  Filled 2020-01-16: qty 50

## 2020-01-16 MED ORDER — FUROSEMIDE 20 MG PO TABS: 20.0000 mg | ORAL_TABLET | Freq: Every day | ORAL | 0 refills | Status: AC | PRN

## 2020-01-16 NOTE — Progress Notes (Signed)
Pharmacy Antibiotic Note  Eula Jaster is a 70 y.o. female admitted on 01/10/2020 with abdominal pain, N/, found to have left ureteral calculus: S/P left ureteral stent 1/6. Proteus bacteremia and Morganella in urine. Pharmacy has been consulted for meropenem dosing.  01/16/20 9:55 AM  - afebrile - WBC now wnl - AKI, SCr improved 1.49 - CrCl ~38 ml/min  Plan: Continue Meropenem 2g IV q12h Follow up renal function & adjust as needed  Height: 5\' 1"  (154.9 cm) Weight: 98.9 kg (218 lb) IBW/kg (Calculated) : 47.8  Temp (24hrs), Avg:98.6 F (37 C), Min:98.4 F (36.9 C), Max:98.9 F (37.2 C)  Recent Labs  Lab 01/10/20 1850 01/10/20 2049 01/11/20 0931 01/12/20 0536 01/12/20 1351 01/13/20 0621 01/14/20 0656 01/14/20 0908 01/15/20 0531 01/16/20 0539  WBC  --   --    < > 14.8*  --  11.3* 11.4*  --  10.8* 9.7  CREATININE 6.51*  --    < > 5.56* 4.94* 3.88*  --  2.46* 1.80* 1.49*  LATICACIDVEN 1.9 1.4  --   --   --   --   --   --   --   --    < > = values in this interval not displayed.    Estimated Creatinine Clearance: 38.4 mL/min (A) (by C-G formula based on SCr of 1.49 mg/dL (H)).    Allergies  Allergen Reactions  . Latex Itching  . Nsaids Other (See Comments)    Contraindicated due to Kidney Disease  . Penicillins Rash    rash    Antimicrobials this admission:  1/5 CTX 1/6 incr 2g >> 1/8 1/8 Meropenem >>  Dose adjustments this admission:   Microbiology results:  1/5 BCx: Proteus mirabilis (pansensitive) 1/5 UCx: >100k Morganella morganii (S imip, R amp, ancef, nitro; I unasyn)  Thank you for allowing pharmacy to be a part of this patient's care.  Ulice Dash D  01/16/2020 9:52 AM

## 2020-01-16 NOTE — Discharge Summary (Signed)
Physician Discharge Summary  Margaret [name] [id number] DOB: [birth year] DOA: 01/10/2020  PCP: Jonathon Bellows, PA-C  Admit date: 01/10/2020 Discharge date: 01/16/2020  Admitted From: Home  Discharge disposition: Home   Recommendations for Outpatient Follow-Up:   . Follow up with your primary care provider in one week.  . Check CBC, BMP, magnesium in the next visit  Discharge Diagnosis:   Principal Problem:   Acute renal failure superimposed on stage 3a chronic kidney disease (HCC) Active Problems:   Hypocalcemia   Hypokalemia   Ureterolithiasis   Complicated UTI (urinary tract infection)   Obesity, Class III, BMI 40-49.9 (morbid obesity) ([place])   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[place])   Bacteremia   Discharge Condition: Improved.  Diet recommendation: Low sodium, heart healthy.   Wound care: None.  Code status: Full.   History of Present Illness:   Ms. Haworth is a 70 yo female with PMH of diabetes mellitus type 2, hypertension, hypothyroidism, CKD stage III  presented to the ER with lower abdominal pain and left-sided flank pain.  Also had nausea and vomiting.  In the ER, CT abdomen/pelvis was notable for a 7 mm obstructing left ureteral stone. There were also complex bilateral renal cysts noted. Urinalysis was consistent with infection. She was started on Rocephin and urology was consulted.  BMP was notable for acute renal failure secondary to outlet obstruction from ureterolithiasis. She was taken to the OR shortly after admission and underwent cystoscopy and left ureteral stent placement.  After stent placement renal function started to improve. Urine culture grew Morganella and blood culture grew Proteus.  Her case was discussed with ID and antibiotics were broadened to meropenem until further culture results. Case again discussed on 1/10 with ID. Plan was to continue meropenem during hospitalization and discharge on Cipro to complete a total of 10-day  course.   Hospital Course:   Following conditions were addressed during hospitalization as listed below,  Acute renal failure superimposed on stage 3a chronic kidney disease/ metabolic acidosis. Patient follows up with [facility] nephrology. Baseline creat 1.4 - 1.5.  Creatinine of 6.4 on admission. CT abdomen/pelvis showed left ureterolithiasis with obstruction. Patient underwent urgent stent placement with urology on admission.  Renal function has significantly improved with IV fluids and stent placement.  Will need outpatient follow-up for discharge.   Complicated UTI  Secondary to obstructive ureteral stone on the left.  Urine culture with Morganella resistant to cephalosporin.  Was on meropenem during hospitalization and will be changed to Cipro on discharge.  Will be renally dosed ciprofloxacin on discharge.   Ureterolithiasis - 7 mm stone obstructing left ureter on CT abd/pelvis on admission.  Status post left ureteral stent placement by urology.  Will need outpatient follow-up with urology for definitive stone management.  Bacteremia -Urine culture with Morganella.  Blood cultures showed pansensitive Proteus. - blood culture noted with Proteus, pansensitive.  Sent was given meropenem while in the hospital.  We will continue ciprofloxacin end date 01/22/2020 to complete 10-day course  Mild hypomagnesemia.  We will replenish with IV magnesium sulfate.  Consider p.o. magnesium oxide on discharge.   Postsurgical hypoparathyroidism -Continue Tums, calcitriol, calcium/vitamin D supplement   Hypokalemia Latest potassium of 4.4.  Has been replenished.    Disposition.  At this time, patient is stable for disposition home with outpatient urology and PCP follow-up   Medical Consultants:    Urology  Procedures:    Left ureteral stent placement, 01/11/2020  Subjective:   Today, patient was seen  and examined at bedside.  Denies any nausea vomiting shortness of breath cough  fever.  Wishes to go home.  Discharge Exam:   Vitals:   01/16/20 0440 01/16/20 1213  BP: (!) 172/118 (!) 193/88  Pulse: 75 73  Resp: 18 19  Temp: 98.4 F (36.9 C) 99.3 F (37.4 C)  SpO2: 100% 96%   Vitals:   01/15/20 1208 01/15/20 2117 01/16/20 0440 01/16/20 1213  BP: (!) 186/114 (!) 195/97 (!) 172/118 (!) 193/88  Pulse: 70 70 75 73  Resp: 16 20 18 19   Temp: 98.9 F (37.2 C) 98.5 F (36.9 C) 98.4 F (36.9 C) 99.3 F (37.4 C)  TempSrc:      SpO2: 100% 100% 100% 96%  Weight:      Height:       Body mass index is 41.19 kg/m.  General: Alert awake, not in obvious distress, morbidly obese HENT: pupils equally reacting to light,  No scleral pallor or icterus noted. Oral mucosa is moist.  Chest:  Clear breath sounds.  Diminished breath sounds bilaterally. No crackles or wheezes.  CVS: S1 &S2 heard. No murmur.  Regular rate and rhythm. Abdomen: Soft, nontender, nondistended.  Bowel sounds are heard.   Extremities: No cyanosis, clubbing or edema.  Peripheral pulses are palpable. Psych: Alert, awake and communicative, CNS:  No cranial nerve deficits.  Power equal in all extremities.   Skin: Warm and dry.  No rashes noted.  The results of significant diagnostics from this hospitalization (including imaging, microbiology, ancillary and laboratory) are listed below for reference.     Diagnostic Studies:   CT Abdomen Pelvis Wo Contrast  Result Date: 01/10/2020 CLINICAL DATA:  Lower abdominal pain. EXAM: CT ABDOMEN AND PELVIS WITHOUT CONTRAST TECHNIQUE: Multidetector CT imaging of the abdomen and pelvis was performed following the standard protocol without IV contrast. COMPARISON:  September 20, 2016 FINDINGS: Lower chest: Very mild atelectasis is seen within the bilateral lung bases. Hepatobiliary: No focal liver abnormality is seen. No gallstones, gallbladder wall thickening, or biliary dilatation. Pancreas: Unremarkable. No pancreatic ductal dilatation or surrounding inflammatory  changes. Spleen: Normal in size without focal abnormality. Adrenals/Urinary Tract: Adrenal glands are unremarkable. Kidneys are normal in size. A 2.4 cm x 1.7 cm well-defined, exophytic area of low attenuation is seen within the anteromedial aspect of the mid to upper right kidney. This area is decreased in size when compared to the prior study. A stable 2.6 cm x 1.9 cm, exophytic, well-defined mildly hyperdense area (approximately 39.78 Hounsfield units) is seen along the posterior aspect of the mid left kidney. 3 mm calcifications are seen within the medial aspect of the mid left kidney. Very mild diffuse calcification of the renal pyramids is also noted on the left. A 7 mm obstructing renal stone is noted within the proximal left ureter with moderate severity left-sided hydronephrosis and hydroureter. A Foley catheter is seen within an empty urinary bladder. Stomach/Bowel: There is a small hiatal hernia. Appendix appears normal. No evidence of bowel dilatation. Noninflamed diverticula are seen within the distal descending and sigmoid colon. Vascular/Lymphatic: No significant vascular findings are present. No enlarged abdominal or pelvic lymph nodes. Reproductive: The uterus is not clearly identified. This area is limited in evaluation secondary to the presence of overlying streak artifact. Other: A 2.6 cm x 1.7 cm fat containing umbilical hernia is seen. There is no evidence of abdominal or pelvic free fluid. Musculoskeletal: Bilateral total hip replacements are seen with an extensive amount of associated streak artifact. Subsequently  limited evaluation of the adjacent osseous and soft tissue structures is noted. IMPRESSION: 1. 7 mm obstructing renal stone within the proximal left ureter. 2. Findings likely consistent with complex bilateral renal cysts. This corresponds to the findings seen on the prior abdomen and pelvis CT and prior renal ultrasound. A hemorrhagic component involving the lesion within the left  kidney cannot be excluded. MRI correlation is recommended to further characterize these areas. 3. Colonic diverticulosis. 4. Small hiatal hernia. 5. Fat-containing umbilical hernia. 6. Bilateral total hip replacements. Electronically Signed   By: Virgina Norfolk M.D.   On: 01/10/2020 20:06   DG C-Arm 1-60 Min-No Report  Result Date: 01/11/2020 Fluoroscopy was utilized by the requesting physician.  No radiographic interpretation.     Labs:   Basic Metabolic Panel: Recent Labs  Lab 01/12/20 0536 01/12/20 1351 01/13/20 0621 01/14/20 0908 01/15/20 0531 01/16/20 0539  NA 133* 133* 135 135 139 139  K 3.2* 3.4* 3.7 3.8 4.5 4.4  CL 99 98 101 102 104 102  CO2 19* 19* 20* 22 26 29   GLUCOSE 93 143* 119* 166* 95 94  BUN 106* 88* 91* 72* 51* 39*  CREATININE 5.56* 4.94* 3.88* 2.46* 1.80* 1.49*  CALCIUM 5.7* 6.3* 7.1* 8.0* 8.7* 9.0  MG 2.2  --  2.1 1.9 1.5* 1.6*   GFR Estimated Creatinine Clearance: 38.4 mL/min (A) (by C-G formula based on SCr of 1.49 mg/dL (H)). Liver Function Tests: Recent Labs  Lab 01/12/20 1351 01/13/20 0621 01/14/20 0908 01/15/20 0531 01/16/20 0539  AST 24 16 19 26 25   ALT 28 24 22 27 29   ALKPHOS 77 67 67 59 58  BILITOT 0.6 0.4 0.6 0.3 0.4  PROT 6.0* 5.7* 6.2* 5.9* 5.9*  ALBUMIN 2.5* 2.2* 2.4* 2.3* 2.3*   Recent Labs  Lab 01/10/20 1651  LIPASE 19   No results for input(s): AMMONIA in the last 168 hours. Coagulation profile No results for input(s): INR, PROTIME in the last 168 hours.  CBC: Recent Labs  Lab 01/12/20 0536 01/13/20 0621 01/14/20 0656 01/15/20 0531 01/16/20 0539  WBC 14.8* 11.3* 11.4* 10.8* 9.7  NEUTROABS 11.6* 8.4* 7.7 7.9* 6.2  HGB 9.5* 10.0* 11.3* 9.8* 9.6*  HCT 28.7* 30.1* 34.0* 30.0* 30.1*  MCV 82.9 82.5 83.1 83.1 85.3  PLT 170 209 263 282 282   Cardiac Enzymes: No results for input(s): CKTOTAL, CKMB, CKMBINDEX, TROPONINI in the last 168 hours. BNP: Invalid input(s): POCBNP CBG: Recent Labs  Lab 01/11/20 0411  01/11/20 0627  GLUCAP 78 83   D-Dimer No results for input(s): DDIMER in the last 72 hours. Hgb A1c No results for input(s): HGBA1C in the last 72 hours. Lipid Profile No results for input(s): CHOL, HDL, LDLCALC, TRIG, CHOLHDL, LDLDIRECT in the last 72 hours. Thyroid function studies No results for input(s): TSH, T4TOTAL, T3FREE, THYROIDAB in the last 72 hours.  Invalid input(s): FREET3 Anemia work up No results for input(s): VITAMINB12, FOLATE, FERRITIN, TIBC, IRON, RETICCTPCT in the last 72 hours. Microbiology Recent Results (from the past 240 hour(s))  Resp Panel by RT-PCR (Flu A&B, Covid) Nasopharyngeal Swab     Status: None   Collection Time: 01/10/20  6:50 PM   Specimen: Nasopharyngeal Swab; Nasopharyngeal(NP) swabs in vial transport medium  Result Value Ref Range Status   SARS Coronavirus 2 by RT PCR NEGATIVE NEGATIVE Final    Comment: (NOTE) SARS-CoV-2 target nucleic acids are NOT DETECTED.  The SARS-CoV-2 RNA is generally detectable in upper respiratory specimens during the acute phase of  infection. The lowest concentration of SARS-CoV-2 viral copies this assay can detect is 138 copies/mL. A negative result does not preclude SARS-Cov-2 infection and should not be used as the sole basis for treatment or other patient management decisions. A negative result may occur with  improper specimen collection/handling, submission of specimen other than nasopharyngeal swab, presence of viral mutation(s) within the areas targeted by this assay, and inadequate number of viral copies(<138 copies/mL). A negative result must be combined with clinical observations, patient history, and epidemiological information. The expected result is Negative.  Fact Sheet for Patients:  EntrepreneurPulse.com.au  Fact Sheet for Healthcare Providers:  IncredibleEmployment.be  This test is no t yet approved or cleared by the [place] FDA and  has been  authorized for detection and/or diagnosis of SARS-CoV-2 by FDA under an Emergency Use Authorization (EUA). This EUA will remain  in effect (meaning this test can be used) for the duration of the COVID-19 declaration under Section 564(b)(1) of the Act, 21 U.S.C.section 360bbb-3(b)(1), unless the authorization is terminated  or revoked sooner.       Influenza A by PCR NEGATIVE NEGATIVE Final   Influenza B by PCR NEGATIVE NEGATIVE Final    Comment: (NOTE) The Xpert Xpress SARS-CoV-2/FLU/RSV plus assay is intended as an aid in the diagnosis of influenza from Nasopharyngeal swab specimens and should not be used as a sole basis for treatment. Nasal washings and aspirates are unacceptable for Xpert Xpress SARS-CoV-2/FLU/RSV testing.  Fact Sheet for Patients: EntrepreneurPulse.com.au  Fact Sheet for Healthcare Providers: IncredibleEmployment.be  This test is not yet approved or cleared by the [place] FDA and has been authorized for detection and/or diagnosis of SARS-CoV-2 by FDA under an Emergency Use Authorization (EUA). This EUA will remain in effect (meaning this test can be used) for the duration of the COVID-19 declaration under Section 564(b)(1) of the Act, 21 U.S.C. section 360bbb-3(b)(1), unless the authorization is terminated or revoked.  Performed at [facility], [address] [place], [place] [address]   Culture, Urine     Status: Abnormal   Collection Time: 01/10/20  7:09 PM   Specimen: Urine, Random  Result Value Ref Range Status   Specimen Description   Final    URINE, RANDOM Performed at [facility], [address] [place], [place] [address]    Special Requests   Final    NONE Performed at [facility], [address] [place], [place] [address]    Culture >=100,000 COLONIES/mL [facility] MORGANII (A)  Final   Report Status 01/13/2020 FINAL  Final   Organism ID, Bacteria MORGANELLA  MORGANII (A)  Final      Susceptibility   Morganella morganii - MIC*    AMPICILLIN >=32 RESISTANT Resistant     CEFAZOLIN >=64 RESISTANT Resistant     CIPROFLOXACIN <=0.25 SENSITIVE Sensitive     GENTAMICIN <=1 SENSITIVE Sensitive     IMIPENEM 2 SENSITIVE Sensitive     NITROFURANTOIN 128 RESISTANT Resistant     TRIMETH/SULFA <=20 SENSITIVE Sensitive     AMPICILLIN/SULBACTAM 16 INTERMEDIATE Intermediate     PIP/TAZO <=4 SENSITIVE Sensitive     * >=100,000 COLONIES/mL MORGANELLA MORGANII  Blood culture (routine x 2)     Status: Abnormal   Collection Time: 01/10/20  8:45 PM   Specimen: BLOOD  Result Value Ref Range Status   Specimen Description   Final    BLOOD RIGHT ANTECUBITAL Performed at [facility], [address] [place], [place] [address]    Special Requests   Final    BOTTLES DRAWN AEROBIC AND ANAEROBIC Blood Culture adequate volume   Culture  Setup Time   Final    GRAM NEGATIVE RODS IN BOTH AEROBIC AND ANAEROBIC BOTTLES CRITICAL RESULT CALLED TO, READ BACK BY AND VERIFIED WITH: Murray Hodgkins GREEN 1426 [id number] FCP Performed at [facility], [place] [address], [place], [place] [address]    Culture PROTEUS MIRABILIS (A)  Final   Report Status 01/13/2020 FINAL  Final   Organism ID, Bacteria PROTEUS MIRABILIS  Final      Susceptibility   Proteus mirabilis - MIC*    AMPICILLIN <=2 SENSITIVE Sensitive     CEFAZOLIN <=4 SENSITIVE Sensitive     CEFEPIME <=0.12 SENSITIVE Sensitive     CEFTAZIDIME <=1 SENSITIVE Sensitive     CEFTRIAXONE <=0.25 SENSITIVE Sensitive     CIPROFLOXACIN <=0.25 SENSITIVE Sensitive     GENTAMICIN <=1 SENSITIVE Sensitive     IMIPENEM 2 SENSITIVE Sensitive     TRIMETH/SULFA <=20 SENSITIVE Sensitive     AMPICILLIN/SULBACTAM <=2 SENSITIVE Sensitive     PIP/TAZO <=4 SENSITIVE Sensitive     * PROTEUS MIRABILIS  Blood Culture ID Panel (Reflexed)     Status: Abnormal   Collection Time: 01/10/20  8:45 PM  Result Value Ref Range Status    Enterococcus faecalis NOT DETECTED NOT DETECTED Final   Enterococcus Faecium NOT DETECTED NOT DETECTED Final   Listeria monocytogenes NOT DETECTED NOT DETECTED Final   Staphylococcus species NOT DETECTED NOT DETECTED Final   Staphylococcus aureus (BCID) NOT DETECTED NOT DETECTED Final   Staphylococcus epidermidis NOT DETECTED NOT DETECTED Final   Staphylococcus lugdunensis NOT DETECTED NOT DETECTED Final   Streptococcus species NOT DETECTED NOT DETECTED Final   Streptococcus agalactiae NOT DETECTED NOT DETECTED Final   Streptococcus pneumoniae NOT DETECTED NOT DETECTED Final   Streptococcus pyogenes NOT DETECTED NOT DETECTED Final   A.calcoaceticus-baumannii NOT DETECTED NOT DETECTED Final   Bacteroides fragilis NOT DETECTED NOT DETECTED Final   Enterobacterales DETECTED (A) NOT DETECTED Final    Comment: Enterobacterales represent a large order of gram negative bacteria, not a single organism. CRITICAL RESULT CALLED TO, READ BACK BY AND VERIFIED WITH: PHARMD T. GREEN 1426 [id number] FCP    Enterobacter cloacae complex NOT DETECTED NOT DETECTED Final   Escherichia coli NOT DETECTED NOT DETECTED Final   Klebsiella aerogenes NOT DETECTED NOT DETECTED Final   Klebsiella oxytoca NOT DETECTED NOT DETECTED Final   Klebsiella pneumoniae NOT DETECTED NOT DETECTED Final   Proteus species DETECTED (A) NOT DETECTED Final    Comment: CRITICAL RESULT CALLED TO, READ BACK BY AND VERIFIED WITH: PHARMD T. GREEN 1426 [id number] FCP    Salmonella species NOT DETECTED NOT DETECTED Final   Serratia marcescens NOT DETECTED NOT DETECTED Final   Haemophilus influenzae NOT DETECTED NOT DETECTED Final   Neisseria meningitidis NOT DETECTED NOT DETECTED Final   Pseudomonas aeruginosa NOT DETECTED NOT DETECTED Final   Stenotrophomonas maltophilia NOT DETECTED NOT DETECTED Final   Candida albicans NOT DETECTED NOT DETECTED Final   Candida auris NOT DETECTED NOT DETECTED Final   Candida glabrata NOT DETECTED NOT  DETECTED Final   Candida krusei NOT DETECTED NOT DETECTED Final   Candida parapsilosis NOT DETECTED NOT DETECTED Final   Candida tropicalis NOT DETECTED NOT DETECTED Final   Cryptococcus neoformans/gattii NOT DETECTED NOT DETECTED Final   CTX-M ESBL NOT DETECTED NOT DETECTED Final   Carbapenem  resistance IMP NOT DETECTED NOT DETECTED Final   Carbapenem resistance KPC NOT DETECTED NOT DETECTED Final   Carbapenem resistance NDM NOT DETECTED NOT DETECTED Final   Carbapenem resist OXA 48 LIKE NOT DETECTED NOT DETECTED Final   Carbapenem resistance VIM NOT DETECTED NOT DETECTED Final    Comment: Performed at [facility], [place] [address] [place], [place] [address]     Discharge Instructions:   Discharge Instructions     Call MD for:  persistant nausea and vomiting   Complete by: As directed    Call MD for:  severe uncontrolled pain   Complete by: As directed    Call MD for:  temperature >100.4   Complete by: As directed    Diet Carb Modified   Complete by: As directed    Discharge instructions   Complete by: As directed    Follow-up with your primary care physician in 1 week.  Follow-up with urology in 2 weeks.  Please call urology office if you do not hear from the office.  Complete the course of antibiotic as prescribed.   Increase activity slowly   Complete by: As directed    No wound care   Complete by: As directed       Allergies as of 01/16/2020       Reactions   Latex Itching   Nsaids Other (See Comments)   Contraindicated due to Kidney Disease   Penicillins Rash   rash        Medication List     TAKE these medications    amLODipine 10 MG tablet Commonly known as: NORVASC Take 1 tablet (10 mg total) by mouth daily. For HTN   aspirin 81 MG chewable tablet Chew by mouth 2 (two) times daily.   atorvastatin 20 MG tablet Commonly known as: LIPITOR Take 1 tablet (20 mg total) by mouth daily.   bisacodyl 10 MG suppository Commonly known as:  DULCOLAX Place 10 mg rectally daily as needed for moderate constipation. FOR UP TO 10 DAYS (IF NO STOOL IN 24 HOURS WHILE RECIEVING OPIODS)   calcitRIOL 0.5 MCG capsule Commonly known as: ROCALTROL Take 1 capsule (0.5 mcg total) by mouth 2 (two) times daily. FOR HYPOCALCEMIA   calcium carbonate 500 MG chewable tablet Commonly known as: TUMS - dosed in mg elemental calcium Chew 1 tablet by mouth 3 (three) times daily with meals.   calcium-vitamin D 500-200 MG-UNIT tablet Commonly known as: OSCAL WITH D Take 1 tablet by mouth 2 (two) times daily.   carvedilol 25 MG tablet Commonly known as: COREG Take 1 tablet (25 mg total) by mouth 2 (two) times daily with a meal.   ciprofloxacin 250 MG tablet Commonly known as: CIPRO Take 1 tablet (250 mg total) by mouth 2 (two) times daily for 6 days.   ferrous sulfate 325 (65 FE) MG tablet Take 325 mg by mouth daily with breakfast. For Anemia   furosemide 20 MG tablet Commonly known as: LASIX Take 1 tablet (20 mg total) by mouth daily as needed (For Lower Leg Swelling). Start taking on: January 18, 2020 What changed: These instructions start on January 18, 2020. If you are unsure what to do until then, ask your doctor or other care provider.   hydrALAZINE 100 MG tablet Commonly known as: APRESOLINE Take 1 tablet (100 mg total) by mouth 3 (three) times daily.   levothyroxine 175 MCG tablet Commonly known as: SYNTHROID Take 1 tablet (175 mcg total) by mouth daily before  breakfast.   lisinopril 40 MG tablet Commonly known as: ZESTRIL Take 1 tablet (40 mg total) by mouth daily.   Magnesium Oxide 400 (240 Mg) MG Tabs Take 1 tablet (400 mg total) by mouth daily.   NON FORMULARY DIET: REGULAR, NAS/CCD   vitamin B-12 1000 MCG tablet Commonly known as: CYANOCOBALAMIN Take 1 tablet (1,000 mcg total) by mouth daily.        Follow-up Information     Raynelle Bring, MD. Schedule an appointment as soon as possible for a visit in 2  week(s).   Specialty: Urology Why: urology followup after stent placement. call office if you dont hear from office in few days Contact information: [place] [address] [phone]         Jonathon Bellows, PA-C. Schedule an appointment as soon as possible for a visit in 1 week(s).   Specialty: Physician Assistant Why: regular check up Contact information: [address] [place] [address] [phone]                  Time coordinating discharge: 39 minutes  Signed:  Bryor Rami  [facility] 01/16/2020, 12:39 PM

## 2020-02-01 ENCOUNTER — Other Ambulatory Visit: Payer: Self-pay | Admitting: Urology

## 2020-02-05 NOTE — Progress Notes (Signed)
DUE TO COVID-19 ONLY ONE VISITOR IS ALLOWED TO COME WITH YOU AND STAY IN THE WAITING ROOM ONLY DURING PRE OP AND PROCEDURE DAY OF SURGERY. THE 1 VISITOR  MAY VISIT WITH YOU AFTER SURGERY IN YOUR PRIVATE ROOM DURING VISITING HOURS ONLY!  YOU NEED TO HAVE A COVID 19 TEST ON___2/01/2020 ____ @_______ , THIS TEST MUST BE DONE BEFORE SURGERY,  COVID TESTING SITE [address] [place] [address], IT IS ON THE RIGHT GOING OUT [address] APPROXIMATELY  2 MINUTES PAST [employer] ON THE RIGHT. ONCE YOUR COVID TEST IS COMPLETED,  PLEASE BEGIN THE QUARANTINE INSTRUCTIONS AS OUTLINED IN YOUR HANDOUT.                Margaret [name]  02/05/2020   Your procedure is scheduled on: 02/08/2020    Report to [facility] Main  Entrance   Report to admitting at    [place] AM     Call this number if you have problems the morning of surgery [phone]    Remember: Do not eat food , candy gum or mints :After Midnight. You may have clear liquids from midnight until 0545AM     CLEAR LIQUID DIET   Foods Allowed                                                                       Coffee and tea, regular and decaf                              Plain Jell-O any favor except red or purple                                            Fruit ices (not with fruit pulp)                                      Iced Popsicles                                     Carbonated beverages, regular and diet                                    Cranberry, grape and apple juices Sports drinks like Gatorade Lightly seasoned clear broth or consume(fat free) Sugar, honey syrup   _____________________________________________________________________    BRUSH YOUR TEETH MORNING OF SURGERY AND RINSE YOUR MOUTH OUT, NO CHEWING GUM CANDY OR MINTS.     Take these medicines the morning of surgery with A SIP OF WATER: AMLODIPINE, COREG, HYDRALAZINE, SYNTHROID   DO NOT TAKE ANY DIABETIC MEDICATIONS DAY OF YOUR  SURGERY                               You may not have any metal  on your body including hair pins and              piercings  Do not wear jewelry, make-up, lotions, powders or perfumes, deodorant             Do not wear nail polish on your fingernails.  Do not shave  48 hours prior to surgery.              Men may shave face and neck.   Do not bring valuables to the hospital. [place].  Contacts, dentures or bridgework may not be worn into surgery.  Leave suitcase in the car. After surgery it may be brought to your room.     Patients discharged the day of surgery will not be allowed to drive home. IF YOU ARE HAVING SURGERY AND GOING HOME THE SAME DAY, YOU MUST HAVE AN ADULT TO DRIVE YOU HOME AND BE WITH YOU FOR 24 HOURS. YOU MAY GO HOME BY TAXI OR UBER OR ORTHERWISE, BUT AN ADULT MUST ACCOMPANY YOU HOME AND STAY WITH YOU FOR 24 HOURS.  Name and phone number of your driver:  Special Instructions: N/A              Please read over the following fact sheets you were given: _____________________________________________________________________  [facility] - Preparing for Surgery Before surgery, you can play an important role.  Because skin is not sterile, your skin needs to be as free of germs as possible.  You can reduce the number of germs on your skin by washing with CHG (chlorahexidine gluconate) soap before surgery.  CHG is an antiseptic cleaner which kills germs and bonds with the skin to continue killing germs even after washing. Please DO NOT use if you have an allergy to CHG or antibacterial soaps.  If your skin becomes reddened/irritated stop using the CHG and inform your nurse when you arrive at Short Stay. Do not shave (including legs and underarms) for at least 48 hours prior to the first CHG shower.  You may shave your face/neck. Please follow these instructions carefully:  1.  Shower with CHG Soap the night before surgery and the   morning of Surgery.  2.  If you choose to wash your hair, wash your hair first as usual with your  normal  shampoo.  3.  After you shampoo, rinse your hair and body thoroughly to remove the  shampoo.                           4.  Use CHG as you would any other liquid soap.  You can apply chg directly  to the skin and wash                       Gently with a scrungie or clean washcloth.  5.  Apply the CHG Soap to your body ONLY FROM THE NECK DOWN.   Do not use on face/ open                           Wound or open sores. Avoid contact with eyes, ears mouth and genitals (private parts).                       Wash face,  Genitals (private parts) with your normal soap.             6.  Wash thoroughly, paying special attention to the area where your surgery  will be performed.  7.  Thoroughly rinse your body with warm water from the neck down.  8.  DO NOT shower/wash with your normal soap after using and rinsing off  the CHG Soap.                9.  Pat yourself dry with a clean towel.            10.  Wear clean pajamas.            11.  Place clean sheets on your bed the night of your first shower and do not  sleep with pets. Day of Surgery : Do not apply any lotions/deodorants the morning of surgery.  Please wear clean clothes to the hospital/surgery center.  FAILURE TO FOLLOW THESE INSTRUCTIONS MAY RESULT IN THE CANCELLATION OF YOUR SURGERY PATIENT SIGNATURE_________________________________  NURSE SIGNATURE__________________________________  ________________________________________________________________________

## 2020-02-06 ENCOUNTER — Other Ambulatory Visit: Payer: Self-pay

## 2020-02-06 ENCOUNTER — Encounter (HOSPITAL_COMMUNITY)
Admission: RE | Admit: 2020-02-06 | Discharge: 2020-02-06 | Disposition: A | Discharge status: Home / Self Care | Payer: Medicare PPO | Source: Ambulatory Visit | Attending: Urology | Admitting: Urology

## 2020-02-06 ENCOUNTER — Other Ambulatory Visit (HOSPITAL_COMMUNITY)
Admission: RE | Admit: 2020-02-06 | Discharge: 2020-02-06 | Disposition: A | Discharge status: Home / Self Care | Payer: Medicare PPO | Source: Ambulatory Visit | Attending: Urology | Admitting: Urology

## 2020-02-06 ENCOUNTER — Encounter (HOSPITAL_COMMUNITY): Payer: Self-pay

## 2020-02-06 DIAGNOSIS — Z20822 Contact with and (suspected) exposure to covid-19: Secondary | ICD-10-CM | POA: Insufficient documentation

## 2020-02-06 DIAGNOSIS — Z01812 Encounter for preprocedural laboratory examination: Secondary | ICD-10-CM | POA: Diagnosis present

## 2020-02-06 HISTORY — DX: Dyspnea, unspecified: R06.00

## 2020-02-06 HISTORY — DX: Personal history of urinary calculi: Z87.442

## 2020-02-06 HISTORY — DX: Prediabetes: R73.03

## 2020-02-06 HISTORY — DX: Anemia, unspecified: D64.9

## 2020-02-06 LAB — CBC
HCT: 37.2 % (ref 36.0–46.0)
Hemoglobin: 11.8 g/dL — ABNORMAL LOW (ref 12.0–15.0)
MCH: 28 pg (ref 26.0–34.0)
MCHC: 31.7 g/dL (ref 30.0–36.0)
MCV: 88.2 fL (ref 80.0–100.0)
Platelets: 269 10*3/uL (ref 150–400)
RBC: 4.22 MIL/uL (ref 3.87–5.11)
RDW: 16.3 % — ABNORMAL HIGH (ref 11.5–15.5)
WBC: 7.1 10*3/uL (ref 4.0–10.5)
nRBC: 0 % (ref 0.0–0.2)

## 2020-02-06 LAB — BASIC METABOLIC PANEL
Anion gap: 13 (ref 5–15)
BUN: 25 mg/dL — ABNORMAL HIGH (ref 8–23)
CO2: 27 mmol/L (ref 22–32)
Calcium: 8 mg/dL — ABNORMAL LOW (ref 8.9–10.3)
Chloride: 101 mmol/L (ref 98–111)
Creatinine, Ser: 1.8 mg/dL — ABNORMAL HIGH (ref 0.44–1.00)
GFR, Estimated: 30 mL/min — ABNORMAL LOW (ref >60)
Glucose, Bld: 110 mg/dL — ABNORMAL HIGH (ref 70–99)
Potassium: 4.1 mmol/L (ref 3.5–5.1)
Sodium: 141 mmol/L (ref 135–145)

## 2020-02-06 LAB — SARS CORONAVIRUS 2 (TAT 6-24 HRS): SARS Coronavirus 2: NEGATIVE

## 2020-02-06 LAB — GLUCOSE, CAPILLARY: Glucose-Capillary: 108 mg/dL — ABNORMAL HIGH (ref 70–99)

## 2020-02-06 NOTE — Progress Notes (Addendum)
Anesthesia Review:  PCP:  Internal Medicine- Dr Benay Pike 01/19/2020  Cardiologist : Chest x-ray : EKG : 01/16/2020  Echo : Stress test: Cardiac Cath :  Activity level: no  Sleep Study/ CPAP : Fasting Blood Sugar :      / Checks Blood Sugar -- times a day:   Blood Thinner/ Instructions /Last Dose: ASA / Instructions/ Last Dose :  Prediabetes  \BMP done 02/06/20 routed to DR Alinda Money.

## 2020-02-07 ENCOUNTER — Encounter (HOSPITAL_COMMUNITY): Payer: Self-pay

## 2020-02-07 NOTE — H&P (Signed)
Office Visit Report     02/01/2020   --------------------------------------------------------------------------------   Margaret [name]  MRN: [id number]  DOB: August 12, 1950, 70 year old Female  SSN:    PRIMARY CARE:    REFERRING:    PROVIDER:  Raynelle Bring, M.D.  LOCATION:  [facility] [phone]     --------------------------------------------------------------------------------   CC/HPI: Left ureteral calculus   Margaret [name] is a 70 year old female who presented to the hospital about 2 weeks ago with a 7 mm left ureteral stone, acute kidney injury, and UTI. She underwent left ureteral stent placement. Her urine culture was positive for Morganella and her blood culture was positive for Proteus. She has completed antibiotic therapy and is feeling much improved. She follows up today with a KUB.   Interestingly, she tells me that she may have some degree of hypoparathyroidism following prior thyroid surgery in the past. She supposedly has dealt with hypocalcemia and has been taking calcium supplementation with Vitamin D but also additional TUMS which may suggest possible excessive calcium ingestion.     ALLERGIES: latex penicillin - Skin Rash    MEDICATIONS: Lisinopril 40 mg tablet  Amlodipine Besylate 10 mg tablet  Atorvastatin Calcium 20 mg tablet  Bisacodyl 10 mg/30 ml enema  Calcitriol 0.5 mcg capsule  Calcium + D  Calcium Carbonate  Coreg 25 mg tablet  Ferrous Sulfate  Furosemide 20 mg tablet  Hydralazine Hcl 100 mg tablet  Levothyroxine 175 mcg capsule  Magnesium 400 mg magnesium capsule  Vitamin B12     GU PSH: Cystoscopy Insert Stent, Left - 01/11/2020     NON-GU PSH: No Non-GU PSH    GU PMH: None     PMH Notes:   1) Urolithiasis: She presented in January 2022 with a proximal left ureteral stone with AKI and a UTI.   Jan 2022: Left ureteral stent (culture positive for Morganella and blood culture positive for Proteus)      NON-GU PMH:  Diabetes Type 2 Hypertension Hypocalcemia Hypothyroidism Mixed hyperlipidemia    FAMILY HISTORY: Cancer - Father Hypertension - Runs in Family   SOCIAL HISTORY: No Social History    VITAL SIGNS:      02/01/2020 01:57 PM  Weight 213 lb / 96.62 kg  Height 61 in / 154.94 cm  BP 167/96 mmHg  Pulse 95 /min  Temperature 98.0 F / 36.6 C  BMI 40.2 kg/m   MULTI-SYSTEM PHYSICAL EXAMINATION:    Constitutional: Well-nourished. No physical deformities. Normally developed. Good grooming.  Respiratory: No labored breathing, no use of accessory muscles. Clear.  Cardiovascular: Normal temperature, normal extremity pulses, no swelling, no varicosities. RRR.  Gastrointestinal: No CVAT.     Complexity of Data:  Records Review:   Previous Patient Records  X-Ray Review: KUB: Reviewed Films.    Notes:                     I independently reviewed her KUB x-ray. Her indwelling stent is in proper position with her ureter noted be deviated medially as was noted at the time of stent placement. Her stone is not well visualized. She has a left pelvic phlebolith.   PROCEDURES:         KUB - [id number]  A single view of the abdomen is obtained.      Patient confirmed No Neulasta OnPro Device.            Urinalysis w/Scope Dipstick Dipstick Cont'd Micro  Color: Straw Bilirubin:  Neg mg/dL WBC/hpf: 6 - 10/hpf  Appearance: Slightly Cloudy Ketones: Neg mg/dL RBC/hpf: 10 - 20/hpf  Specific Gravity: 1.015 Blood: 2+ ery/uL Bacteria: Few (10-25/hpf)  pH: 7.0 Protein: 1+ mg/dL Cystals: NS (Not Seen)  Glucose: Neg mg/dL Urobilinogen: 0.2 mg/dL Casts: NS (Not Seen)    Nitrites: Neg Trichomonas: Not Present    Leukocyte Esterase: 2+ leu/uL Mucous: Not Present      Epithelial Cells: 0 - 5/hpf      Yeast: NS (Not Seen)      Sperm: Not Present    ASSESSMENT:      ICD-10 Details  1 GU:   Ureteral calculus - N20.1    PLAN:           Orders Labs Urine Culture, BMP          Schedule Return  Visit/Planned Activity: Other See Visit Notes             Note: Will call to schedule surgery.          Document Letter(s):  Created for Patient: Clinical Summary         Notes:   1. Left ureteral calculus: Her stone is not well visualized on plain imaging. I recommended proceeding with cystoscopy, left ureteroscopy with laser lithotripsy and left ureteral stent placement. Potential risks, complications, and the expected recovery process was discussed and she gives informed consent to proceed. Her urine will be recultured today and her renal function will be checked.           Next Appointment:      Next Appointment: 02/08/2020 08:45 AM    Appointment Type: Surgery     Location: [facility] [phone]    Provider: Raynelle Bring, M.D.    Reason for Visit: WL/OP CYSTO , LT URS LL, LT UR STENT      * Signed by Raynelle Bring, M.D. on 02/01/20 at 3:42 PM (EST*

## 2020-02-08 ENCOUNTER — Ambulatory Visit (HOSPITAL_COMMUNITY): Payer: Medicare PPO

## 2020-02-08 ENCOUNTER — Ambulatory Visit (HOSPITAL_COMMUNITY)
Admission: RE | Admit: 2020-02-08 | Discharge: 2020-02-08 | Disposition: A | Discharge status: Home / Self Care | Payer: Medicare PPO | Attending: Urology | Admitting: Urology

## 2020-02-08 ENCOUNTER — Ambulatory Visit (HOSPITAL_COMMUNITY): Payer: Medicare PPO | Admitting: Physician Assistant

## 2020-02-08 ENCOUNTER — Ambulatory Visit (HOSPITAL_COMMUNITY): Payer: Medicare PPO | Admitting: Anesthesiology

## 2020-02-08 ENCOUNTER — Encounter (HOSPITAL_COMMUNITY): Payer: Self-pay | Admitting: Urology

## 2020-02-08 ENCOUNTER — Encounter (HOSPITAL_COMMUNITY)
Admission: RE | Disposition: A | Discharge status: Home / Self Care | Payer: Self-pay | Source: Home / Self Care | Attending: Urology

## 2020-02-08 DIAGNOSIS — Z87442 Personal history of urinary calculi: Secondary | ICD-10-CM | POA: Diagnosis not present

## 2020-02-08 DIAGNOSIS — Z79899 Other long term (current) drug therapy: Secondary | ICD-10-CM | POA: Insufficient documentation

## 2020-02-08 DIAGNOSIS — N201 Calculus of ureter: Secondary | ICD-10-CM | POA: Insufficient documentation

## 2020-02-08 DIAGNOSIS — Z880 Allergy status to penicillin: Secondary | ICD-10-CM | POA: Insufficient documentation

## 2020-02-08 DIAGNOSIS — Z91040 Latex allergy status: Secondary | ICD-10-CM | POA: Diagnosis not present

## 2020-02-08 HISTORY — PX: CYSTOSCOPY/URETEROSCOPY/HOLMIUM LASER/STENT PLACEMENT: SHX6546

## 2020-02-08 LAB — GLUCOSE, CAPILLARY: Glucose-Capillary: 121 mg/dL — ABNORMAL HIGH (ref 70–99)

## 2020-02-08 SURGERY — CYSTOSCOPY/URETEROSCOPY/HOLMIUM LASER/STENT PLACEMENT
Anesthesia: General | Site: Ureter | Laterality: Left

## 2020-02-08 MED ORDER — DEXAMETHASONE SODIUM PHOSPHATE 10 MG/ML IJ SOLN
INTRAMUSCULAR | Status: DC | PRN
  Administered 2020-02-08: 5 mg via INTRAVENOUS

## 2020-02-08 MED ORDER — FENTANYL CITRATE (PF) 100 MCG/2ML IJ SOLN
INTRAMUSCULAR | Status: AC
  Filled 2020-02-08: qty 2

## 2020-02-08 MED ORDER — PROPOFOL 10 MG/ML IV BOLUS
INTRAVENOUS | Status: DC | PRN
  Administered 2020-02-08: 150 mg via INTRAVENOUS

## 2020-02-08 MED ORDER — FENTANYL CITRATE (PF) 100 MCG/2ML IJ SOLN
INTRAMUSCULAR | Status: DC | PRN
  Administered 2020-02-08: 50 ug via INTRAVENOUS
  Administered 2020-02-08 (×2): 25 ug via INTRAVENOUS

## 2020-02-08 MED ORDER — PHENYLEPHRINE 40 MCG/ML (10ML) SYRINGE FOR IV PUSH (FOR BLOOD PRESSURE SUPPORT)
PREFILLED_SYRINGE | INTRAVENOUS | Status: DC | PRN
  Administered 2020-02-08 (×2): 80 ug via INTRAVENOUS
  Administered 2020-02-08: 40 ug via INTRAVENOUS
  Administered 2020-02-08: 80 ug via INTRAVENOUS
  Administered 2020-02-08: 40 ug via INTRAVENOUS

## 2020-02-08 MED ORDER — AMISULPRIDE (ANTIEMETIC) 5 MG/2ML IV SOLN: 10.0000 mg | Freq: Once | INTRAVENOUS | Status: DC | PRN

## 2020-02-08 MED ORDER — SODIUM CHLORIDE 0.9 % IV SOLN
2.0000 g | Freq: Once | INTRAVENOUS | Status: AC
  Administered 2020-02-08: 2 g via INTRAVENOUS
  Filled 2020-02-08: qty 20

## 2020-02-08 MED ORDER — PROMETHAZINE HCL 25 MG/ML IJ SOLN: 6.2500 mg | INTRAMUSCULAR | Status: DC | PRN

## 2020-02-08 MED ORDER — DEXAMETHASONE SODIUM PHOSPHATE 10 MG/ML IJ SOLN
INTRAMUSCULAR | Status: AC
  Filled 2020-02-08: qty 1

## 2020-02-08 MED ORDER — LIDOCAINE 2% (20 MG/ML) 5 ML SYRINGE
INTRAMUSCULAR | Status: DC | PRN
  Administered 2020-02-08: 80 mg via INTRAVENOUS

## 2020-02-08 MED ORDER — CHLORHEXIDINE GLUCONATE 0.12 % MT SOLN
15.0000 mL | Freq: Once | OROMUCOSAL | Status: AC
  Administered 2020-02-08: 15 mL via OROMUCOSAL

## 2020-02-08 MED ORDER — EPHEDRINE 5 MG/ML INJ
INTRAVENOUS | Status: AC
  Filled 2020-02-08: qty 10

## 2020-02-08 MED ORDER — CIPROFLOXACIN HCL 250 MG PO TABS: 250.0000 mg | ORAL_TABLET | Freq: Two times a day (BID) | ORAL | 0 refills | Status: AC

## 2020-02-08 MED ORDER — SODIUM CHLORIDE 0.9 % IR SOLN
Status: DC | PRN
  Administered 2020-02-08: 3000 mL

## 2020-02-08 MED ORDER — IOHEXOL 300 MG/ML  SOLN
INTRAMUSCULAR | Status: DC | PRN
  Administered 2020-02-08: 10 mL

## 2020-02-08 MED ORDER — LACTATED RINGERS IV SOLN: INTRAVENOUS | Status: DC

## 2020-02-08 MED ORDER — PHENYLEPHRINE 40 MCG/ML (10ML) SYRINGE FOR IV PUSH (FOR BLOOD PRESSURE SUPPORT)
PREFILLED_SYRINGE | INTRAVENOUS | Status: AC
  Filled 2020-02-08: qty 10

## 2020-02-08 MED ORDER — MIDAZOLAM HCL 5 MG/5ML IJ SOLN
INTRAMUSCULAR | Status: DC | PRN
  Administered 2020-02-08: 2 mg via INTRAVENOUS

## 2020-02-08 MED ORDER — OXYCODONE HCL 5 MG/5ML PO SOLN: 5.0000 mg | Freq: Once | ORAL | Status: DC | PRN

## 2020-02-08 MED ORDER — ONDANSETRON HCL 4 MG/2ML IJ SOLN
INTRAMUSCULAR | Status: AC
  Filled 2020-02-08: qty 2

## 2020-02-08 MED ORDER — LIDOCAINE HCL (PF) 2 % IJ SOLN
INTRAMUSCULAR | Status: AC
  Filled 2020-02-08: qty 5

## 2020-02-08 MED ORDER — MIDAZOLAM HCL 2 MG/2ML IJ SOLN
INTRAMUSCULAR | Status: AC
  Filled 2020-02-08: qty 2

## 2020-02-08 MED ORDER — OXYCODONE HCL 5 MG PO TABS: 5.0000 mg | ORAL_TABLET | Freq: Once | ORAL | Status: DC | PRN

## 2020-02-08 MED ORDER — FENTANYL CITRATE (PF) 100 MCG/2ML IJ SOLN: 25.0000 ug | INTRAMUSCULAR | Status: DC | PRN

## 2020-02-08 MED ORDER — EPHEDRINE SULFATE-NACL 50-0.9 MG/10ML-% IV SOSY
PREFILLED_SYRINGE | INTRAVENOUS | Status: DC | PRN
  Administered 2020-02-08 (×2): 10 mg via INTRAVENOUS

## 2020-02-08 MED ORDER — PROPOFOL 10 MG/ML IV BOLUS
INTRAVENOUS | Status: AC
  Filled 2020-02-08: qty 20

## 2020-02-08 MED ORDER — ONDANSETRON HCL 4 MG/2ML IJ SOLN
INTRAMUSCULAR | Status: DC | PRN
  Administered 2020-02-08: 4 mg via INTRAVENOUS

## 2020-02-08 SURGICAL SUPPLY — 19 items
BAG URO CATCHER STRL LF (MISCELLANEOUS) ×2 IMPLANT
BASKET ZERO TIP NITINOL 2.4FR (BASKET) ×2 IMPLANT
CATH INTERMIT  6FR 70CM (CATHETERS) IMPLANT
CLOTH BEACON ORANGE TIMEOUT ST (SAFETY) ×2 IMPLANT
GLOVE SURG ENC TEXT LTX SZ7.5 (GLOVE) ×2 IMPLANT
GOWN STRL REUS W/TWL LRG LVL3 (GOWN DISPOSABLE) ×2 IMPLANT
GUIDEWIRE STR DUAL SENSOR (WIRE) ×2 IMPLANT
GUIDEWIRE ZIPWRE .038 STRAIGHT (WIRE) ×2 IMPLANT
IV NS 1000ML (IV SOLUTION) ×1
IV NS 1000ML BAXH (IV SOLUTION) ×1 IMPLANT
KIT TURNOVER KIT A (KITS) ×2 IMPLANT
LASER FIB FLEXIVA PULSE ID 365 (Laser) IMPLANT
MANIFOLD NEPTUNE II (INSTRUMENTS) ×2 IMPLANT
PACK CYSTO (CUSTOM PROCEDURE TRAY) ×2 IMPLANT
SHEATH URETERAL 12FRX35CM (MISCELLANEOUS) IMPLANT
TRACTIP FLEXIVA PULS ID 200XHI (Laser) IMPLANT
TRACTIP FLEXIVA PULSE ID 200 (Laser)
TUBING CONNECTING 10 (TUBING) ×2 IMPLANT
TUBING UROLOGY SET (TUBING) ×2 IMPLANT

## 2020-02-08 NOTE — Discharge Instructions (Signed)
1. You may see some blood in the urine and may have some burning with urination for 48-72 hours. You also may notice that you have to urinate more frequently or urgently after your procedure which is normal.  °2. You should call should you develop an inability urinate, fever > 101, persistent nausea and vomiting that prevents you from eating or drinking to stay hydrated.  °

## 2020-02-08 NOTE — Op Note (Signed)
Preoperative diagnosis: Left ureteral calculus  Postoperative diagnosis: Left renal calculus  Procedures: 1.  Cystoscopy 2.  Left ureteroscopy with stone removal 3.  Removal of left ureteral stent  Surgeon: Pryor Curia MD  Anesthesia: General  Complications: None  EBL: Minimal  Specimens: None  Indication: Margaret [name] is a 70 year old female who recently presented with a 7 mm proximal left ureteral calculus with evidence of urinary tract infection and acute renal failure.  She underwent urgent left ureteral stent placement and was treated with appropriate antibiotic therapy to resolve her infection.  She presents today for definitive stone management with the above procedure.  The potential risks, complications, and the alternative options have been discussed in detail.  Informed consent was obtained.  Description of procedure: The patient was taken to the operating room and a general anesthetic was administered.  She was given preoperative antibiotics, placed in the dorsolithotomy position, and prepped and draped in usual sterile fashion.  Next, a preoperative timeout was performed.  Cystourethroscopy was then performed which revealed a normal-appearing bladder without evidence of bladder tumors, stones, or other mucosal pathology.  The indwelling left ureteral stent was identified and brought out to the urethral meatus with flexible graspers.  A 0.38 sensor guidewire was then advanced through the stent up into the left renal collecting system under fluoroscopic guidance.  Using a 6 French semirigid ureteroscope, the entire left ureter was able to be visualized up to the level of the left ureteropelvic junction.  No calculus was identified.  The ureter was well dilated and a 2nd Glidewire was then placed and the semirigid ureteroscope was removed.  A digital flexible ureteroscope was then advanced over the Glidewire into the left renal collecting system and the entire renal  collecting system was visualized.  There was noted to be a fair amount of loose, matrix material within the renal collecting system.  There was 1 more formed area of matrix material that likely represented her stone seen on her prior imaging.  This was removed with a 0 tip nitinol basket.  The ureter was well dilated with minimal edema and it was not felt that further ureteral stenting would be necessary.  The safety wire was removed.  The bladder was emptied.  The procedure was ended.  She tolerated the procedure well without complications and was able to be transferred to the recovery unit in satisfactory condition.

## 2020-02-08 NOTE — Anesthesia Preprocedure Evaluation (Addendum)
Anesthesia Evaluation  Patient identified by MRN, date of birth, ID band Patient awake    Reviewed: Allergy & Precautions, H&P , NPO status , Patient's Chart, lab work & pertinent test results, reviewed documented beta blocker date and time   Airway Mallampati: II  TM Distance: >3 FB Neck ROM: Full    Dental no notable dental hx. (+) Dental Advisory Given,    Pulmonary neg pulmonary ROS,    Pulmonary exam normal breath sounds clear to auscultation       Cardiovascular Exercise Tolerance: Good METS: 3 - Mets hypertension, Pt. on medications and Pt. on home beta blockers  Rhythm:Regular Rate:Normal     Neuro/Psych negative neurological ROS  negative psych ROS   GI/Hepatic negative GI ROS, Neg liver ROS,   Endo/Other  diabetesHypothyroidism Morbid obesity  Renal/GU ARF and Renal InsufficiencyRenal disease (kidney stones)  negative genitourinary   Musculoskeletal  (+) Arthritis , Osteoarthritis,    Abdominal   Peds negative pediatric ROS (+)  Hematology  (+) anemia ,   Anesthesia Other Findings   Reproductive/Obstetrics negative OB ROS                            Anesthesia Physical  Anesthesia Plan  ASA: III  Anesthesia Plan: General   Post-op Pain Management:    Induction: Intravenous  PONV Risk Score and Plan: 3 and Ondansetron, Treatment may vary due to age or medical condition, Midazolam and Dexamethasone  Airway Management Planned: LMA  Additional Equipment:   Intra-op Plan:   Post-operative Plan: Extubation in OR  Informed Consent: I have reviewed the patients History and Physical, chart, labs and discussed the procedure including the risks, benefits and alternatives for the proposed anesthesia with the patient or authorized representative who has indicated his/her understanding and acceptance.     Dental advisory given  Plan Discussed with: CRNA, Anesthesiologist and  Surgeon  Anesthesia Plan Comments:         Anesthesia Quick Evaluation

## 2020-02-08 NOTE — Anesthesia Procedure Notes (Signed)
Procedure Name: LMA Insertion Date/Time: 02/08/2020 9:37 AM Performed by: Maxwell Caul, CRNA Pre-anesthesia Checklist: Patient identified, Emergency Drugs available, Suction available and Patient being monitored Patient Re-evaluated:Patient Re-evaluated prior to induction Oxygen Delivery Method: Circle system utilized Preoxygenation: Pre-oxygenation with 100% oxygen Induction Type: IV induction LMA: LMA inserted LMA Size: 4.0 Number of attempts: 1 Placement Confirmation: positive ETCO2 and breath sounds checked- equal and bilateral Tube secured with: Tape Dental Injury: Teeth and Oropharynx as per pre-operative assessment

## 2020-02-08 NOTE — Anesthesia Postprocedure Evaluation (Signed)
Anesthesia Post Note  Patient: Merced Brougham  Procedure(s) Performed: CYSTOSCOPY/URETEROSCOPY LEFT, STONE REMOVAL REMOVAL LEFT URETERAL STENT (Left Ureter)     Patient location during evaluation: PACU Anesthesia Type: General Level of consciousness: awake and alert Pain management: pain level controlled Vital Signs Assessment: post-procedure vital signs reviewed and stable Respiratory status: spontaneous breathing, nonlabored ventilation and respiratory function stable Cardiovascular status: blood pressure returned to baseline and stable Postop Assessment: no apparent nausea or vomiting Anesthetic complications: no   No complications documented.  Last Vitals:  Vitals:   02/08/20 1045 02/08/20 1110  BP: 128/81 (!) 147/81  Pulse: 70 71  Resp: (!) 27   Temp: 36.6 C 36.5 C  SpO2: 94% 95%    Last Pain:  Vitals:   02/08/20 1110  TempSrc: Oral  PainSc: 0-No pain                 Merlinda Frederick

## 2020-02-08 NOTE — Transfer of Care (Signed)
Immediate Anesthesia Transfer of Care Note  Patient: Margaret [name]  Procedure(s) Performed: CYSTOSCOPY/URETEROSCOPY LEFT, STONE REMOVAL REMOVAL LEFT URETERAL STENT (Left Ureter)  Patient Location: PACU  Anesthesia Type:General  Level of Consciousness: awake, alert  and oriented  Airway & Oxygen Therapy: Patient Spontanous Breathing and Patient connected to face mask oxygen  Post-op Assessment: Report given to RN and Post -op Vital signs reviewed and stable  Post vital signs: Reviewed and stable  Last Vitals:  Vitals Value Taken Time  BP    Temp    Pulse 69 02/08/20 1015  Resp 17 02/08/20 1015  SpO2 100 % 02/08/20 1015  Vitals shown include unvalidated device data.  Last Pain:  Vitals:   02/08/20 0712  TempSrc:   PainSc: 0-No pain         Complications: No complications documented.

## 2020-02-08 NOTE — Interval H&P Note (Signed)
History and Physical Interval Note:  02/08/2020 7:14 AM  Margaret [name]  has presented today for surgery, with the diagnosis of LEFT URETERAL CALCULUS.  The various methods of treatment have been discussed with the patient and family. After consideration of risks, benefits and other options for treatment, the patient has consented to  Procedure(s): CYSTOSCOPY/URETEROSCOPY/HOLMIUM LASER/STENT PLACEMENT (Left) as a surgical intervention.  The patient's history has been reviewed, patient examined, no change in status, stable for surgery.  I have reviewed the patient's chart and labs.  Questions were answered to the patient's satisfaction.     [employer]

## 2020-02-09 ENCOUNTER — Encounter (HOSPITAL_COMMUNITY): Payer: Self-pay | Admitting: Urology

## 2020-04-01 ENCOUNTER — Other Ambulatory Visit: Payer: Self-pay | Admitting: Urology

## 2020-04-01 ENCOUNTER — Other Ambulatory Visit (HOSPITAL_COMMUNITY): Payer: Self-pay | Admitting: Urology

## 2020-04-01 DIAGNOSIS — D49512 Neoplasm of unspecified behavior of left kidney: Secondary | ICD-10-CM

## 2020-04-09 ENCOUNTER — Other Ambulatory Visit: Payer: Self-pay

## 2020-04-09 ENCOUNTER — Ambulatory Visit (HOSPITAL_COMMUNITY)
Admission: RE | Admit: 2020-04-09 | Discharge: 2020-04-09 | Disposition: A | Discharge status: Home / Self Care | Payer: Medicare PPO | Source: Ambulatory Visit | Attending: Urology | Admitting: Urology

## 2020-04-09 DIAGNOSIS — D49512 Neoplasm of unspecified behavior of left kidney: Secondary | ICD-10-CM | POA: Insufficient documentation

## 2020-04-09 MED ORDER — GADOBUTROL 1 MMOL/ML IV SOLN
10.0000 mL | Freq: Once | INTRAVENOUS | Status: AC | PRN
  Administered 2020-04-09: 10 mL via INTRAVENOUS

## 2020-05-08 ENCOUNTER — Other Ambulatory Visit: Payer: Self-pay | Admitting: Urology

## 2020-05-31 NOTE — Patient Instructions (Addendum)
DUE TO COVID-19 ONLY ONE VISITOR IS ALLOWED TO COME WITH YOU AND STAY IN THE WAITING ROOM ONLY DURING PRE OP AND PROCEDURE DAY OF SURGERY. THE 1 VISITOR  MAY VISIT WITH YOU AFTER SURGERY IN YOUR PRIVATE ROOM DURING VISITING HOURS ONLY!  YOU NEED TO HAVE A COVID 19 TEST ON: 06/10/20 @ 2:30 PM , THIS TEST MUST BE DONE BEFORE SURGERY,  COVID TESTING SITE [place] [address], IT IS ON THE RIGHT GOING OUT [address] APPROXIMATELY  2 MINUTES PAST [employer] ON THE RIGHT. ONCE YOUR COVID TEST IS COMPLETED,  PLEASE BEGIN THE QUARANTINE INSTRUCTIONS AS OUTLINED IN YOUR HANDOUT.                Margaret [name]    Your procedure is scheduled on: 06/13/20   Report to [facility] Main  Entrance   Report to admitting at: 9:15 AM     Call this number if you have problems the morning of surgery [phone]    Remember: Do not eat solid food  :After Midnight. Clear liquids until: 8:15 am.  CLEAR LIQUID DIET  Foods Allowed                                                                     Foods Excluded  Coffee and tea, regular and decaf                             liquids that you cannot  Plain Jell-O any favor except red or purple                                           see through such as: Fruit ices (not with fruit pulp)                                     milk, soups, orange juice  Iced Popsicles                                    All solid food Carbonated beverages, regular and diet                                    Cranberry, grape and apple juices Sports drinks like Gatorade Lightly seasoned clear broth or consume(fat free) Sugar, honey syrup  Sample Menu Breakfast                                Lunch                                     Supper Cranberry juice  Beef broth                            Chicken broth Jell-O                                     Grape juice                           Apple juice Coffee or tea                         Jell-O                                      Popsicle                                                Coffee or tea                        Coffee or tea  _____________________________________________________________________  BRUSH YOUR TEETH MORNING OF SURGERY AND RINSE YOUR MOUTH OUT, NO CHEWING GUM CANDY OR MINTS.   Take these medicines the morning of surgery with A SIP OF WATER:carvedilol.amlodipine,levothyroxine.                                You may not have any metal on your body including hair pins and              piercings  Do not wear jewelry, make-up, lotions, powders or perfumes, deodorant             Do not wear nail polish on your fingernails.  Do not shave  48 hours prior to surgery.    Do not bring valuables to the hospital. [place].  Contacts, dentures or bridgework may not be worn into surgery.  Leave suitcase in the car. After surgery it may be brought to your room.     Patients discharged the day of surgery will not be allowed to drive home. IF YOU ARE HAVING SURGERY AND GOING HOME THE SAME DAY, YOU MUST HAVE AN ADULT TO DRIVE YOU HOME AND BE WITH YOU FOR 24 HOURS. YOU MAY GO HOME BY TAXI OR UBER OR ORTHERWISE, BUT AN ADULT MUST ACCOMPANY YOU HOME AND STAY WITH YOU FOR 24 HOURS.  Name and phone number of your driver:  Special Instructions: N/A              Please read over the following fact sheets you were given: _____________________________________________________________________         [facility] - Preparing for Surgery Before surgery, you can play an important role.  Because skin is not sterile, your skin needs to be as free of germs as possible.  You can reduce the number of germs on your skin by washing with CHG (chlorahexidine gluconate) soap before surgery.  CHG is an antiseptic cleaner which  kills germs and bonds with the skin to continue killing germs even after washing. Please DO NOT use if you  have an allergy to CHG or antibacterial soaps.  If your skin becomes reddened/irritated stop using the CHG and inform your nurse when you arrive at Short Stay. Do not shave (including legs and underarms) for at least 48 hours prior to the first CHG shower.  You may shave your face/neck. Please follow these instructions carefully:  1.  Shower with CHG Soap the night before surgery and the  morning of Surgery.  2.  If you choose to wash your hair, wash your hair first as usual with your  normal  shampoo.  3.  After you shampoo, rinse your hair and body thoroughly to remove the  shampoo.                           4.  Use CHG as you would any other liquid soap.  You can apply chg directly  to the skin and wash                       Gently with a scrungie or clean washcloth.  5.  Apply the CHG Soap to your body ONLY FROM THE NECK DOWN.   Do not use on face/ open                           Wound or open sores. Avoid contact with eyes, ears mouth and genitals (private parts).                       Wash face,  Genitals (private parts) with your normal soap.             6.  Wash thoroughly, paying special attention to the area where your surgery  will be performed.  7.  Thoroughly rinse your body with warm water from the neck down.  8.  DO NOT shower/wash with your normal soap after using and rinsing off  the CHG Soap.                9.  Pat yourself dry with a clean towel.            10.  Wear clean pajamas.            11.  Place clean sheets on your bed the night of your first shower and do not  sleep with pets. Day of Surgery : Do not apply any lotions/deodorants the morning of surgery.  Please wear clean clothes to the hospital/surgery center.  FAILURE TO FOLLOW THESE INSTRUCTIONS MAY RESULT IN THE CANCELLATION OF YOUR SURGERY PATIENT SIGNATURE_________________________________  NURSE  SIGNATURE__________________________________  ________________________________________________________________________

## 2020-06-04 ENCOUNTER — Encounter (HOSPITAL_COMMUNITY): Payer: Self-pay

## 2020-06-04 ENCOUNTER — Encounter (HOSPITAL_COMMUNITY)
Admission: RE | Admit: 2020-06-04 | Discharge: 2020-06-04 | Disposition: A | Discharge status: Home / Self Care | Payer: Medicare PPO | Source: Ambulatory Visit | Attending: Urology | Admitting: Urology

## 2020-06-04 ENCOUNTER — Other Ambulatory Visit: Payer: Self-pay

## 2020-06-04 DIAGNOSIS — Z01812 Encounter for preprocedural laboratory examination: Secondary | ICD-10-CM | POA: Diagnosis present

## 2020-06-04 LAB — CBC
HCT: 43.4 % (ref 36.0–46.0)
Hemoglobin: 13.6 g/dL (ref 12.0–15.0)
MCH: 26.7 pg (ref 26.0–34.0)
MCHC: 31.3 g/dL (ref 30.0–36.0)
MCV: 85.1 fL (ref 80.0–100.0)
Platelets: 258 10*3/uL (ref 150–400)
RBC: 5.1 MIL/uL (ref 3.87–5.11)
RDW: 15.3 % (ref 11.5–15.5)
WBC: 5.8 10*3/uL (ref 4.0–10.5)
nRBC: 0 % (ref 0.0–0.2)

## 2020-06-04 LAB — BASIC METABOLIC PANEL
Anion gap: 13 (ref 5–15)
BUN: 25 mg/dL — ABNORMAL HIGH (ref 8–23)
CO2: 26 mmol/L (ref 22–32)
Calcium: 8.8 mg/dL — ABNORMAL LOW (ref 8.9–10.3)
Chloride: 104 mmol/L (ref 98–111)
Creatinine, Ser: 1.4 mg/dL — ABNORMAL HIGH (ref 0.44–1.00)
GFR, Estimated: 40 mL/min — ABNORMAL LOW (ref >60)
Glucose, Bld: 102 mg/dL — ABNORMAL HIGH (ref 70–99)
Potassium: 4 mmol/L (ref 3.5–5.1)
Sodium: 143 mmol/L (ref 135–145)

## 2020-06-04 NOTE — Progress Notes (Signed)
COVID Vaccine Completed: Yes Date COVID Vaccine completed: 04/2020 [place] COVID vaccine manufacturer:   Moderna     PCP - Mathews Argyle PA-C. Cardiologist -   Chest x-ray -  EKG -  Stress Test -  ECHO -  Cardiac Cath -  Pacemaker/ICD device last checked:  Sleep Study -  CPAP -   Fasting Blood Sugar -  Checks Blood Sugar _____ times a day  Blood Thinner Instructions: Aspirin Instructions: Last Dose:  Anesthesia review: Hx: HTN  Patient denies shortness of breath, fever, cough and chest pain at PAT appointment   Patient verbalized understanding of instructions that were given to them at the PAT appointment. Patient was also instructed that they will need to review over the PAT instructions again at home before surgery.

## 2020-06-04 NOTE — Progress Notes (Signed)
Pt's BP was 212/118,pt. Said she forgot to take her BP medicine today,she's asymptomatic.Jessica PA-C was consulted,pt. Was advise to go home and take her medicine,then check her BP at some point today,if BP still hight,to notify PCP.Also, if she develops any symptom like SOB,headache,dizziness or chest pain,to go to ED or urgent care.Pt. verbalized her understanding of these recommendations.

## 2020-06-05 LAB — HEMOGLOBIN A1C
Hgb A1c MFr Bld: 6.1 % — ABNORMAL HIGH (ref 4.8–5.6)
Mean Plasma Glucose: 128 mg/dL

## 2020-06-10 ENCOUNTER — Other Ambulatory Visit (HOSPITAL_COMMUNITY)
Admission: RE | Admit: 2020-06-10 | Discharge: 2020-06-10 | Disposition: A | Discharge status: Home / Self Care | Payer: Medicare PPO | Source: Ambulatory Visit | Attending: Urology | Admitting: Urology

## 2020-06-10 DIAGNOSIS — Z20822 Contact with and (suspected) exposure to covid-19: Secondary | ICD-10-CM | POA: Diagnosis not present

## 2020-06-10 DIAGNOSIS — Z01812 Encounter for preprocedural laboratory examination: Secondary | ICD-10-CM | POA: Insufficient documentation

## 2020-06-11 LAB — SARS CORONAVIRUS 2 (TAT 6-24 HRS): SARS Coronavirus 2: NEGATIVE

## 2020-06-12 NOTE — H&P (Signed)
Bilateral renal neoplasms   Margaret [name] is a 70 year old who presented to me initially with a left ureteral stone and UTI and AKI. She underwent stent placement and subsequent ureteroscopy. She returns today after undergoing an MRI of her abdomen due to a hyperdense lesion noted on the left kidney on her non-contrast CT scan. Her MRI demonstrated an enhancing 2.7 cm complex mass off the posteromedial aspect of the right kidney suspicious for malignancy and another left sided posterior lesion that also demonstrates enhancement. Although this was read as coming off the inferior pole, on independent review, it appears to be off the upper pole. There is also an Bosniak IIF lesion of the interpolar, perihilar region of the left kidney. She has not had past abdominal surgery except for a hysterectomy. Her last chest imaging was about one year ago. She denies hematuria.     ALLERGIES: latex penicillin - Skin Rash    MEDICATIONS: Lisinopril 40 mg tablet  Amlodipine Besylate 10 mg tablet  Atorvastatin Calcium 20 mg tablet  Bisacodyl 10 mg/30 ml enema  Calcitriol 0.5 mcg capsule  Calcium + D  Calcium Carbonate  Coreg 25 mg tablet  Ferrous Sulfate  Furosemide 20 mg tablet  Hydralazine Hcl 100 mg tablet  Levothyroxine 175 mcg capsule  Magnesium 400 mg magnesium capsule  Vitamin B12     GU PSH: Cysto Remove Stent FB Sim - 02/08/2020 Cystoscopy Insert Stent, Left - 01/11/2020 Ureteroscopic stone removal, Left - 02/08/2020     NON-GU PSH: No Non-GU PSH    GU PMH: Left renal neoplasm - 03/26/2020 Ureteral calculus - 03/26/2020, - 03/06/2020, - 02/01/2020      PMH Notes:   1) Urolithiasis: She presented in January 2022 with a proximal left ureteral stone with AKI and a UTI.   Jan 2022: Left ureteral stent (culture positive for Morganella and blood culture positive for Proteus)  Feb 2022: Left ureteroscopic laser lithotripsy and stent removal   2) Bilateral renal masses: On her non-contrast  imaging for her stone disease, she was noted to have a hyperdense lesion on the left kidney. Further evaluation with an MRI with and without contrast in April 2022 revealed bilateral renal lesions suspicious for renal malignancy.      NON-GU PMH: Diabetes Type 2 Hypertension Hypocalcemia Hypothyroidism Mixed hyperlipidemia    FAMILY HISTORY: Cancer - Father Hypertension - Runs in Family   SOCIAL HISTORY: No Social History    REVIEW OF SYSTEMS:    GU Review Female:   Patient denies frequent urination, hard to postpone urination, burning /pain with urination, get up at night to urinate, leakage of urine, stream starts and stops, trouble starting your stream, have to strain to urinate, and currently pregnant.  Gastrointestinal (Upper):   Patient denies nausea and vomiting.  Gastrointestinal (Lower):   Patient denies diarrhea and constipation.  Constitutional:   Patient denies fever, night sweats, weight loss, and fatigue.  Skin:   Patient denies skin rash/ lesion and itching.  Eyes:   Patient denies blurred vision and double vision.  Ears/ Nose/ Throat:   Patient denies sore throat and sinus problems.  Hematologic/Lymphatic:   Patient denies swollen glands and easy bruising.  Cardiovascular:   Patient denies leg swelling and chest pains.  Respiratory:   Patient denies cough and shortness of breath.  Endocrine:   Patient denies excessive thirst.  Musculoskeletal:   Patient denies back pain and joint pain.  Neurological:   Patient denies headaches and dizziness.  Psychologic:  Patient denies depression and anxiety.   VITAL SIGNS:     Weight 210 lb / 95.25 kg  Height 67 in / 170.18 cm  BMI 32.9 kg/m   MULTI-SYSTEM PHYSICAL EXAMINATION:    Constitutional: Well-nourished. No physical deformities. Normally developed. Good grooming.  Neck: Neck symmetrical, not swollen. Normal tracheal position.  Respiratory: No labored breathing, no use of accessory muscles. Clear bilaterally.   Cardiovascular: Normal temperature, normal extremity pulses, no swelling, no varicosities. RRR.  Lymphatic: No enlargement of neck, axillae, groin.  Skin: No paleness, no jaundice, no cyanosis. No lesion, no ulcer, no rash.  Neurologic / Psychiatric: Oriented to time, oriented to place, oriented to person. No depression, no anxiety, no agitation.  Gastrointestinal: No mass, no tenderness, no rigidity, non obese abdomen.  Eyes: Normal conjunctivae. Normal eyelids.  Ears, Nose, Mouth, and Throat: Left ear no scars, no lesions, no masses. Right ear no scars, no lesions, no masses. Nose no scars, no lesions, no masses. Normal hearing. Normal lips.  Musculoskeletal: Normal gait and station of head and neck.     Complexity of Data:  Records Review:   Previous Patient Records  X-Ray Review: MRI Abdomen: Reviewed Films.     PROCEDURES: None   ASSESSMENT:      ICD-10 Details  1 GU:   Right renal neoplasm - D49.511   2   Left renal neoplasm - D49.512    PLAN:      1. Bilateral enhancing renal masses: We discussed her MRI results and the concern for possibly bilateral renal cell carcinoma. She will undergo chest imaging and laboratory studies today to complete her metastatic evaluation.   The patient was provided information regarding their renal mass including the relative risk of benign versus malignant pathology and the natural history of renal cell carcinoma and other possible malignancies of the kidney. The role of renal biopsy, laboratory testing, and imaging studies to further characterize renal masses and/or the presence of metastatic disease were explained. We discussed the role of active surveillance, surgical therapy with both radical nephrectomy and nephron-sparing surgery, and ablative therapy in the treatment of renal masses. In addition, we discussed our goals of providing an accurate diagnosis and oncologic control while maintaining optimal renal function as appropriate based on the  size, location, and complexity of their renal mass as well as their co-morbidities. We have discussed the risks of treatment in detail including but not limited to bleeding, infection, heart attack, stroke, death, venothromoboembolism, cancer recurrence, injury/damage to surrounding organs and structures, urine leak, the possibility of open surgical conversion for patients undergoing minimally invasive surgery, the risk of developing chronic kidney disease and its associated implications, and the potential risk of end stage renal disease possibly necessitating dialysis.   After reviewing options, I recommended addressing the largest tumor on the right kidney first. She understands her options for treatment and elects to proceed with a right robot assisted laparoscopic partial nephrectomy.        APPENDED NOTES:  I spoke with Margaret [name] today. We reviewed her CT scan of the chest which is clear in does not demonstrate evidence of metastatic disease. I did recommend proceeding with her planned partial nephrectomy of the right kidney. She also has apparently since seen a nephrologist, Dr. Audie Clear. Margaret [name] was confused as there was an insinuation that she would be undergoing a total nephrectomy. I clarify to her that she would be undergoing a partial nephrectomy in order to help optimize maintenance of her renal function.  She felt much better and is agreeable to proceed as planned.

## 2020-06-13 ENCOUNTER — Encounter (HOSPITAL_COMMUNITY): Payer: Self-pay | Admitting: Urology

## 2020-06-13 ENCOUNTER — Observation Stay (HOSPITAL_COMMUNITY)
Admission: AD | Admit: 2020-06-13 | Discharge: 2020-06-18 | Disposition: A | Discharge status: Home / Self Care | Payer: Medicare PPO | Attending: Urology | Admitting: Urology

## 2020-06-13 ENCOUNTER — Encounter (HOSPITAL_COMMUNITY)
Admission: AD | Disposition: A | Discharge status: Home / Self Care | Payer: Self-pay | Source: Home / Self Care | Attending: Urology

## 2020-06-13 ENCOUNTER — Ambulatory Visit (HOSPITAL_COMMUNITY): Payer: Medicare PPO | Admitting: Physician Assistant

## 2020-06-13 ENCOUNTER — Other Ambulatory Visit: Payer: Self-pay

## 2020-06-13 DIAGNOSIS — E039 Hypothyroidism, unspecified: Secondary | ICD-10-CM | POA: Insufficient documentation

## 2020-06-13 DIAGNOSIS — C641 Malignant neoplasm of right kidney, except renal pelvis: Secondary | ICD-10-CM | POA: Diagnosis present

## 2020-06-13 DIAGNOSIS — Z79899 Other long term (current) drug therapy: Secondary | ICD-10-CM | POA: Insufficient documentation

## 2020-06-13 DIAGNOSIS — N189 Chronic kidney disease, unspecified: Secondary | ICD-10-CM | POA: Diagnosis not present

## 2020-06-13 DIAGNOSIS — I129 Hypertensive chronic kidney disease with stage 1 through stage 4 chronic kidney disease, or unspecified chronic kidney disease: Secondary | ICD-10-CM | POA: Diagnosis not present

## 2020-06-13 DIAGNOSIS — D49512 Neoplasm of unspecified behavior of left kidney: Secondary | ICD-10-CM | POA: Insufficient documentation

## 2020-06-13 DIAGNOSIS — Z91040 Latex allergy status: Secondary | ICD-10-CM | POA: Diagnosis not present

## 2020-06-13 DIAGNOSIS — R262 Difficulty in walking, not elsewhere classified: Secondary | ICD-10-CM | POA: Insufficient documentation

## 2020-06-13 DIAGNOSIS — Z20822 Contact with and (suspected) exposure to covid-19: Secondary | ICD-10-CM | POA: Insufficient documentation

## 2020-06-13 DIAGNOSIS — D49511 Neoplasm of unspecified behavior of right kidney: Secondary | ICD-10-CM | POA: Diagnosis present

## 2020-06-13 HISTORY — PX: ROBOTIC ASSITED PARTIAL NEPHRECTOMY: SHX6087

## 2020-06-13 LAB — BASIC METABOLIC PANEL
Anion gap: 11 (ref 5–15)
BUN: 22 mg/dL (ref 8–23)
CO2: 27 mmol/L (ref 22–32)
Calcium: 8.2 mg/dL — ABNORMAL LOW (ref 8.9–10.3)
Chloride: 104 mmol/L (ref 98–111)
Creatinine, Ser: 1.69 mg/dL — ABNORMAL HIGH (ref 0.44–1.00)
GFR, Estimated: 32 mL/min — ABNORMAL LOW (ref >60)
Glucose, Bld: 127 mg/dL — ABNORMAL HIGH (ref 70–99)
Potassium: 4.3 mmol/L (ref 3.5–5.1)
Sodium: 142 mmol/L (ref 135–145)

## 2020-06-13 LAB — TYPE AND SCREEN
ABO/RH(D): AB NEG
Antibody Screen: NEGATIVE

## 2020-06-13 LAB — HEMOGLOBIN AND HEMATOCRIT, BLOOD
HCT: 41.3 % (ref 36.0–46.0)
Hemoglobin: 12.9 g/dL (ref 12.0–15.0)

## 2020-06-13 LAB — ABO/RH: ABO/RH(D): AB NEG

## 2020-06-13 SURGERY — NEPHRECTOMY, PARTIAL, ROBOT-ASSISTED
Anesthesia: General | Laterality: Right

## 2020-06-13 MED ORDER — ROCURONIUM BROMIDE 10 MG/ML (PF) SYRINGE
PREFILLED_SYRINGE | INTRAVENOUS | Status: AC
  Filled 2020-06-13: qty 10

## 2020-06-13 MED ORDER — DEXAMETHASONE SODIUM PHOSPHATE 4 MG/ML IJ SOLN
INTRAMUSCULAR | Status: DC | PRN
  Administered 2020-06-13: 5 mg via INTRAVENOUS

## 2020-06-13 MED ORDER — EPHEDRINE SULFATE-NACL 50-0.9 MG/10ML-% IV SOSY
PREFILLED_SYRINGE | INTRAVENOUS | Status: DC | PRN
  Administered 2020-06-13: 10 mg via INTRAVENOUS
  Administered 2020-06-13: 5 mg via INTRAVENOUS

## 2020-06-13 MED ORDER — LACTATED RINGERS IV SOLN: INTRAVENOUS | Status: DC

## 2020-06-13 MED ORDER — FENTANYL CITRATE (PF) 250 MCG/5ML IJ SOLN
INTRAMUSCULAR | Status: AC
  Filled 2020-06-13: qty 5

## 2020-06-13 MED ORDER — MAGNESIUM CITRATE PO SOLN: 0.5000 | Freq: Once | ORAL | Status: DC

## 2020-06-13 MED ORDER — ACETAMINOPHEN 10 MG/ML IV SOLN
1000.0000 mg | Freq: Four times a day (QID) | INTRAVENOUS | Status: AC
  Administered 2020-06-13 – 2020-06-14 (×3): 1000 mg via INTRAVENOUS
  Filled 2020-06-13 (×4): qty 100

## 2020-06-13 MED ORDER — DEXTROSE-NACL 5-0.45 % IV SOLN: INTRAVENOUS | Status: DC

## 2020-06-13 MED ORDER — SODIUM CHLORIDE (PF) 0.9 % IJ SOLN
INTRAMUSCULAR | Status: DC | PRN
  Administered 2020-06-13: 20 mL

## 2020-06-13 MED ORDER — AMLODIPINE BESYLATE 10 MG PO TABS
10.0000 mg | ORAL_TABLET | Freq: Every day | ORAL | Status: DC
  Administered 2020-06-14 – 2020-06-18 (×5): 10 mg via ORAL
  Filled 2020-06-13 (×5): qty 1

## 2020-06-13 MED ORDER — PHENYLEPHRINE 40 MCG/ML (10ML) SYRINGE FOR IV PUSH (FOR BLOOD PRESSURE SUPPORT)
PREFILLED_SYRINGE | INTRAVENOUS | Status: AC
  Filled 2020-06-13: qty 10

## 2020-06-13 MED ORDER — LEVOTHYROXINE SODIUM 100 MCG PO TABS
200.0000 ug | ORAL_TABLET | Freq: Every day | ORAL | Status: DC
  Administered 2020-06-14 – 2020-06-18 (×5): 200 ug via ORAL
  Filled 2020-06-13 (×5): qty 2

## 2020-06-13 MED ORDER — ONDANSETRON HCL 4 MG/2ML IJ SOLN
INTRAMUSCULAR | Status: DC | PRN
  Administered 2020-06-13: 4 mg via INTRAVENOUS

## 2020-06-13 MED ORDER — BUPIVACAINE LIPOSOME 1.3 % IJ SUSP
20.0000 mL | Freq: Once | INTRAMUSCULAR | Status: AC
  Administered 2020-06-13: 20 mL
  Filled 2020-06-13: qty 20

## 2020-06-13 MED ORDER — CEFAZOLIN SODIUM-DEXTROSE 2-4 GM/100ML-% IV SOLN
2.0000 g | Freq: Once | INTRAVENOUS | Status: AC
  Administered 2020-06-13: 2 g via INTRAVENOUS
  Filled 2020-06-13: qty 100

## 2020-06-13 MED ORDER — SUGAMMADEX SODIUM 200 MG/2ML IV SOLN
INTRAVENOUS | Status: DC | PRN
  Administered 2020-06-13: 100 mg via INTRAVENOUS
  Administered 2020-06-13: 200 mg via INTRAVENOUS

## 2020-06-13 MED ORDER — DOCUSATE SODIUM 100 MG PO CAPS: 100.0000 mg | ORAL_CAPSULE | Freq: Two times a day (BID) | ORAL | Status: AC

## 2020-06-13 MED ORDER — LISINOPRIL 20 MG PO TABS
40.0000 mg | ORAL_TABLET | Freq: Every day | ORAL | Status: DC
  Administered 2020-06-14 – 2020-06-18 (×5): 40 mg via ORAL
  Filled 2020-06-13 (×5): qty 2

## 2020-06-13 MED ORDER — ORAL CARE MOUTH RINSE: 15.0000 mL | Freq: Once | OROMUCOSAL | Status: AC

## 2020-06-13 MED ORDER — CARVEDILOL 25 MG PO TABS
25.0000 mg | ORAL_TABLET | Freq: Two times a day (BID) | ORAL | Status: DC
  Administered 2020-06-13 – 2020-06-18 (×10): 25 mg via ORAL
  Filled 2020-06-13 (×10): qty 1

## 2020-06-13 MED ORDER — OXYCODONE HCL 5 MG/5ML PO SOLN: 5.0000 mg | Freq: Once | ORAL | Status: DC | PRN

## 2020-06-13 MED ORDER — CEFAZOLIN SODIUM-DEXTROSE 1-4 GM/50ML-% IV SOLN
1.0000 g | Freq: Three times a day (TID) | INTRAVENOUS | Status: AC
  Administered 2020-06-13 – 2020-06-14 (×2): 1 g via INTRAVENOUS
  Filled 2020-06-13 (×2): qty 50

## 2020-06-13 MED ORDER — STERILE WATER FOR IRRIGATION IR SOLN
Status: DC | PRN
  Administered 2020-06-13: 1000 mL

## 2020-06-13 MED ORDER — TRAMADOL HCL 50 MG PO TABS: 50.0000 mg | ORAL_TABLET | Freq: Four times a day (QID) | ORAL | 0 refills | Status: DC | PRN

## 2020-06-13 MED ORDER — PROPOFOL 10 MG/ML IV BOLUS
INTRAVENOUS | Status: DC | PRN
  Administered 2020-06-13: 170 mg via INTRAVENOUS

## 2020-06-13 MED ORDER — ONDANSETRON HCL 4 MG/2ML IJ SOLN
INTRAMUSCULAR | Status: AC
  Filled 2020-06-13: qty 2

## 2020-06-13 MED ORDER — EPHEDRINE 5 MG/ML INJ
INTRAVENOUS | Status: AC
  Filled 2020-06-13: qty 10

## 2020-06-13 MED ORDER — CHLORHEXIDINE GLUCONATE CLOTH 2 % EX PADS
6.0000 | MEDICATED_PAD | Freq: Every day | CUTANEOUS | Status: DC
  Administered 2020-06-14 – 2020-06-15 (×2): 6 via TOPICAL

## 2020-06-13 MED ORDER — LACTATED RINGERS IR SOLN
Status: DC | PRN
  Administered 2020-06-13: 1000 mL

## 2020-06-13 MED ORDER — LACTATED RINGERS IV SOLN: INTRAVENOUS | Status: DC | PRN

## 2020-06-13 MED ORDER — DEXAMETHASONE SODIUM PHOSPHATE 10 MG/ML IJ SOLN
INTRAMUSCULAR | Status: AC
  Filled 2020-06-13: qty 1

## 2020-06-13 MED ORDER — SODIUM CHLORIDE (PF) 0.9 % IJ SOLN
INTRAMUSCULAR | Status: AC
  Filled 2020-06-13: qty 20

## 2020-06-13 MED ORDER — LIDOCAINE 2% (20 MG/ML) 5 ML SYRINGE
INTRAMUSCULAR | Status: AC
  Filled 2020-06-13: qty 5

## 2020-06-13 MED ORDER — ROCURONIUM BROMIDE 10 MG/ML (PF) SYRINGE
PREFILLED_SYRINGE | INTRAVENOUS | Status: DC | PRN
  Administered 2020-06-13: 80 mg via INTRAVENOUS
  Administered 2020-06-13 (×2): 20 mg via INTRAVENOUS

## 2020-06-13 MED ORDER — DOCUSATE SODIUM 100 MG PO CAPS
100.0000 mg | ORAL_CAPSULE | Freq: Two times a day (BID) | ORAL | Status: DC
  Administered 2020-06-13 – 2020-06-18 (×10): 100 mg via ORAL
  Filled 2020-06-13 (×10): qty 1

## 2020-06-13 MED ORDER — PROPOFOL 10 MG/ML IV BOLUS
INTRAVENOUS | Status: AC
  Filled 2020-06-13: qty 20

## 2020-06-13 MED ORDER — HYDROMORPHONE HCL 1 MG/ML IJ SOLN
0.2500 mg | INTRAMUSCULAR | Status: DC | PRN
  Administered 2020-06-13: 0.5 mg via INTRAVENOUS
  Administered 2020-06-13 (×2): 0.25 mg via INTRAVENOUS

## 2020-06-13 MED ORDER — ONDANSETRON HCL 4 MG/2ML IJ SOLN: 4.0000 mg | Freq: Once | INTRAMUSCULAR | Status: DC | PRN

## 2020-06-13 MED ORDER — HYDRALAZINE HCL 50 MG PO TABS
100.0000 mg | ORAL_TABLET | Freq: Three times a day (TID) | ORAL | Status: DC
  Administered 2020-06-13 – 2020-06-18 (×14): 100 mg via ORAL
  Filled 2020-06-13 (×14): qty 2

## 2020-06-13 MED ORDER — PHENYLEPHRINE 40 MCG/ML (10ML) SYRINGE FOR IV PUSH (FOR BLOOD PRESSURE SUPPORT)
PREFILLED_SYRINGE | INTRAVENOUS | Status: DC | PRN
  Administered 2020-06-13: 80 ug via INTRAVENOUS
  Administered 2020-06-13 (×3): 120 ug via INTRAVENOUS

## 2020-06-13 MED ORDER — CHLORHEXIDINE GLUCONATE 0.12 % MT SOLN
15.0000 mL | Freq: Once | OROMUCOSAL | Status: AC
  Administered 2020-06-13: 15 mL via OROMUCOSAL

## 2020-06-13 MED ORDER — MORPHINE SULFATE (PF) 2 MG/ML IV SOLN: 2.0000 mg | INTRAVENOUS | Status: DC | PRN

## 2020-06-13 MED ORDER — PHENYLEPHRINE HCL-NACL 10-0.9 MG/250ML-% IV SOLN
INTRAVENOUS | Status: DC | PRN
  Administered 2020-06-13: 40 ug/min via INTRAVENOUS

## 2020-06-13 MED ORDER — HYDROMORPHONE HCL 1 MG/ML IJ SOLN
INTRAMUSCULAR | Status: AC
  Filled 2020-06-13: qty 1

## 2020-06-13 MED ORDER — PHENYLEPHRINE HCL (PRESSORS) 10 MG/ML IV SOLN
INTRAVENOUS | Status: AC
  Filled 2020-06-13: qty 1

## 2020-06-13 MED ORDER — ACETAMINOPHEN 10 MG/ML IV SOLN: 1000.0000 mg | Freq: Once | INTRAVENOUS | Status: DC | PRN

## 2020-06-13 MED ORDER — LIDOCAINE 2% (20 MG/ML) 5 ML SYRINGE
INTRAMUSCULAR | Status: DC | PRN
  Administered 2020-06-13: 80 mg via INTRAVENOUS

## 2020-06-13 MED ORDER — OXYCODONE HCL 5 MG PO TABS: 5.0000 mg | ORAL_TABLET | Freq: Once | ORAL | Status: DC | PRN

## 2020-06-13 MED ORDER — FUROSEMIDE 20 MG PO TABS: 20.0000 mg | ORAL_TABLET | Freq: Every day | ORAL | Status: DC | PRN

## 2020-06-13 MED ORDER — FENTANYL CITRATE (PF) 100 MCG/2ML IJ SOLN
INTRAMUSCULAR | Status: DC | PRN
  Administered 2020-06-13 (×3): 50 ug via INTRAVENOUS
  Administered 2020-06-13: 100 ug via INTRAVENOUS

## 2020-06-13 MED ORDER — ATORVASTATIN CALCIUM 20 MG PO TABS
20.0000 mg | ORAL_TABLET | Freq: Every day | ORAL | Status: DC
  Administered 2020-06-13 – 2020-06-18 (×6): 20 mg via ORAL
  Filled 2020-06-13 (×6): qty 1

## 2020-06-13 SURGICAL SUPPLY — 56 items
APPLICATOR SURGIFLO ENDO (HEMOSTASIS) IMPLANT
CHLORAPREP W/TINT 26 (MISCELLANEOUS) ×3 IMPLANT
CLIP VESOLOCK LG 6/CT PURPLE (CLIP) ×3 IMPLANT
CLIP VESOLOCK MED LG 6/CT (CLIP) ×6 IMPLANT
COVER SURGICAL LIGHT HANDLE (MISCELLANEOUS) ×3 IMPLANT
COVER TIP SHEARS 8 DVNC (MISCELLANEOUS) ×1 IMPLANT
COVER TIP SHEARS 8MM DA VINCI (MISCELLANEOUS) ×2
COVER WAND RF STERILE (DRAPES) IMPLANT
DECANTER SPIKE VIAL GLASS SM (MISCELLANEOUS) ×3 IMPLANT
DERMABOND ADVANCED (GAUZE/BANDAGES/DRESSINGS) ×4
DERMABOND ADVANCED .7 DNX12 (GAUZE/BANDAGES/DRESSINGS) ×2 IMPLANT
DRAIN CHANNEL 15F RND FF 3/16 (WOUND CARE) ×3 IMPLANT
DRAPE ARM DVNC X/XI (DISPOSABLE) ×4 IMPLANT
DRAPE COLUMN DVNC XI (DISPOSABLE) ×1 IMPLANT
DRAPE DA VINCI XI ARM (DISPOSABLE) ×8
DRAPE DA VINCI XI COLUMN (DISPOSABLE) ×2
DRAPE INCISE IOBAN 66X45 STRL (DRAPES) ×3 IMPLANT
DRAPE SHEET LG 3/4 BI-LAMINATE (DRAPES) ×3 IMPLANT
DRSG TEGADERM 4X4.75 (GAUZE/BANDAGES/DRESSINGS) ×3 IMPLANT
ELECT PENCIL ROCKER SW 15FT (MISCELLANEOUS) ×3 IMPLANT
ELECT REM PT RETURN 15FT ADLT (MISCELLANEOUS) ×3 IMPLANT
EVACUATOR SILICONE 100CC (DRAIN) ×3 IMPLANT
GAUZE SPONGE 4X4 12PLY STRL (GAUZE/BANDAGES/DRESSINGS) ×3 IMPLANT
GLOVE SURG ENC MOIS LTX SZ6.5 (GLOVE) ×3 IMPLANT
GLOVE SURG ENC TEXT LTX SZ7.5 (GLOVE) ×6 IMPLANT
GOWN STRL REUS W/TWL LRG LVL3 (GOWN DISPOSABLE) ×9 IMPLANT
HEMOSTAT SURGICEL 4X8 (HEMOSTASIS) IMPLANT
HOLDER FOLEY CATH W/STRAP (MISCELLANEOUS) ×3 IMPLANT
IRRIG SUCT STRYKERFLOW 2 WTIP (MISCELLANEOUS) ×3
IRRIGATION SUCT STRKRFLW 2 WTP (MISCELLANEOUS) ×1 IMPLANT
KIT BASIN OR (CUSTOM PROCEDURE TRAY) ×3 IMPLANT
KIT TURNOVER KIT A (KITS) ×3 IMPLANT
MARKER SKIN DUAL TIP RULER LAB (MISCELLANEOUS) ×3 IMPLANT
POUCH SPECIMEN RETRIEVAL 10MM (ENDOMECHANICALS) ×3 IMPLANT
PROTECTOR NERVE ULNAR (MISCELLANEOUS) ×6 IMPLANT
SCISSORS LAP 5X45 EPIX DISP (ENDOMECHANICALS) ×3 IMPLANT
SEAL CANN UNIV 5-8 DVNC XI (MISCELLANEOUS) ×4 IMPLANT
SEAL XI 5MM-8MM UNIVERSAL (MISCELLANEOUS) ×8
SET TUBE SMOKE EVAC HIGH FLOW (TUBING) ×3 IMPLANT
SOLUTION ELECTROLUBE (MISCELLANEOUS) ×3 IMPLANT
SURGIFLO W/THROMBIN 8M KIT (HEMOSTASIS) ×3 IMPLANT
SUT ETHILON 3 0 PS 1 (SUTURE) ×3 IMPLANT
SUT MNCRL AB 4-0 PS2 18 (SUTURE) ×6 IMPLANT
SUT V-LOC BARB 180 2/0GR6 GS22 (SUTURE) ×3
SUT VIC AB 0 CT1 27 (SUTURE) ×2
SUT VIC AB 0 CT1 27XBRD ANTBC (SUTURE) ×1 IMPLANT
SUT VICRYL 0 UR6 27IN ABS (SUTURE) ×6 IMPLANT
SUT VLOC BARB 180 ABS3/0GR12 (SUTURE) ×6
SUTURE V-LC BRB 180 2/0GR6GS22 (SUTURE) ×1 IMPLANT
SUTURE VLOC BRB 180 ABS3/0GR12 (SUTURE) ×2 IMPLANT
TOWEL OR 17X26 10 PK STRL BLUE (TOWEL DISPOSABLE) ×3 IMPLANT
TOWEL OR NON WOVEN STRL DISP B (DISPOSABLE) ×3 IMPLANT
TRAY FOLEY MTR SLVR 16FR STAT (SET/KITS/TRAYS/PACK) ×3 IMPLANT
TRAY LAPAROSCOPIC (CUSTOM PROCEDURE TRAY) ×3 IMPLANT
TROCAR XCEL 12X100 BLDLESS (ENDOMECHANICALS) ×3 IMPLANT
WATER STERILE IRR 1000ML POUR (IV SOLUTION) ×3 IMPLANT

## 2020-06-13 NOTE — Anesthesia Preprocedure Evaluation (Signed)
Anesthesia Evaluation  Patient identified by MRN, date of birth, ID band Patient awake    Reviewed: Allergy & Precautions, NPO status , Patient's Chart, lab work & pertinent test results  Airway Mallampati: III  TM Distance: <3 FB Neck ROM: Full    Dental no notable dental hx.    Pulmonary neg pulmonary ROS,    Pulmonary exam normal breath sounds clear to auscultation       Cardiovascular hypertension, Pt. on medications Normal cardiovascular exam Rhythm:Regular Rate:Normal     Neuro/Psych negative neurological ROS  negative psych ROS   GI/Hepatic negative GI ROS, Neg liver ROS,   Endo/Other  Hypothyroidism obesity  Renal/GU negative Renal ROS  negative genitourinary   Musculoskeletal negative musculoskeletal ROS (+)   Abdominal   Peds negative pediatric ROS (+)  Hematology negative hematology ROS (+)   Anesthesia Other Findings   Reproductive/Obstetrics negative OB ROS                             Anesthesia Physical Anesthesia Plan  ASA: 3  Anesthesia Plan: General   Post-op Pain Management:    Induction: Intravenous  PONV Risk Score and Plan: 3 and Ondansetron, Dexamethasone and Treatment may vary due to age or medical condition  Airway Management Planned: Oral ETT  Additional Equipment:   Intra-op Plan:   Post-operative Plan: Extubation in OR  Informed Consent: I have reviewed the patients History and Physical, chart, labs and discussed the procedure including the risks, benefits and alternatives for the proposed anesthesia with the patient or authorized representative who has indicated his/her understanding and acceptance.     Dental advisory given  Plan Discussed with: CRNA and Surgeon  Anesthesia Plan Comments:         Anesthesia Quick Evaluation

## 2020-06-13 NOTE — Op Note (Signed)
Preoperative diagnosis: Right renal neoplasm  Postoperative diagnosis: Right renal neoplasm  Procedure:  Right robotic-assisted laparoscopic partial nephrectomy  Surgeon: Pryor Curia. M.D.  Assistant(s): Debbrah Alar, PA-C  An assistant was required for this surgical procedure.  The duties of the assistant included but were not limited to suctioning, passing suture, camera manipulation, retraction. This procedure would not be able to be performed without an Environmental consultant.  Resident: Dr Ermelinda Das  Anesthesia: General  Complications: None  EBL: 50 mL  IVF:  2300 mL crystalloid  Specimens: Right renal neoplasm  Disposition of specimens: Pathology  Intraoperative findings:       1. Warm renal ischemia time: 21 minutes  Drains: # 15 Blake perinephric drain  Indication:  Margaret [name] is a 70 y.o. year old patient with a right renal mass.  After a thorough review of the management options for their renal mass, they elected to proceed with surgical treatment and the above procedure.  We have discussed the potential benefits and risks of the procedure, side effects of the proposed treatment, the likelihood of the patient achieving the goals of the procedure, and any potential problems that might occur during the procedure or recuperation. Informed consent has been obtained.   Description of procedure:  The patient was taken to the operating room and a general anesthetic was administered. The patient was given preoperative antibiotics, placed in the right modified flank position with care to pad all potential pressure points, and prepped and draped in the usual sterile fashion. Next a preoperative timeout was performed.  A site was selected on in the midline for placement of the assistant port. This was placed using a standard open Hassan technique which allowed entry into the peritoneal cavity under direct vision and without difficulty. A 12 mm port was placed and a  pneumoperitoneum established. The camera was then used to inspect the abdomen and there was no evidence of any intra-abdominal injuries or other abnormalities. The remaining abdominal ports were then placed. 8 mm robotic ports were placed in the right upper quadrant, right lower quadrant, and far right lateral abdominal wall. An 8 mm port was placed for the camera site just right of the umbilicus. All ports were placed under direct vision without difficulty. The surgical cart was then docked.   Utilizing the cautery scissors, the white line of Toldt was incised allowing the colon to be mobilized medially and the plane between the mesocolon and the anterior layer of Gerota's fascia to be developed and the kidney to be exposed.  The ureter and gonadal vein were identified inferiorly and the ureter was lifted anteriorly off the psoas muscle.  Dissection proceeded superiorly along the gonadal vein until the renal vein was identified.  The renal hilum was then carefully isolated with a combination of blunt and sharp dissection allowing the renal arterial and venous structures to be separated and isolated in preparation for renal hilar vessel clamping. There was a branching renal artery and a single renal vein.    Attention turned to the kidney and the perinephric fat surrounding the renal mass was removed and the kidney was mobilized sufficiently for exposure and resection of the renal mass.   Once the renal mass was properly isolated, preparations were made for resection of the tumor.  Reconstructive sutures were placed into the abdomen for the renorrhaphy portion of the procedure.  The renal artery branches and renal vein were then clamped with bulldog clamps.  The tumor was then excised with cold  scissor dissection along with an adequate visible gross margin of normal renal parenchyma. The tumor appeared to be excised without any gross violation of the tumor. The renal collecting system was entered during  removal of the tumor.  A running 3-0 V-lock suture was then brought through the capsule of the kidney and run along the base of the renal defect to provide hemostasis and close any entry into the renal collecting system if present. Weck clips were used to secure this suture outside the renal capsule at the proximal and distal ends. An additional hemostatic agent (Surgiflo) was then placed into the renal defect. A running 2-0 V lock suture was then used to close the capsule of the kidney using a sliding clip technique which resulted in excellent hemostasis.    The bulldog clamps were then removed from the renal hilar vessel(s). Total warm renal ischemia time was 21 minutes. The renal tumor resection site was examined. Hemostasis appeared adequate.   The kidney was placed back into its normal anatomic position and covered with perinephric fat as needed.  A # 97 Blake drain was then brought through the lateral lower port site and positioned in the perinephric space.  It was secured to the skin with a nylon suture. The surgical cart was undocked.  The renal tumor specimen was removed intact within an endopouch retrieval bag via the upper midline port site. This incision site was closed at the fascial layer with 0-vicryl suture. All other laparoscopic/robotic ports were removed under direct vision and the pneumoperitoneum let down with inspection of the operative field performed and hemostasis again confirmed. All incision sites were then injected with local anesthetic and reapproximated at the skin level with 4-0 monocryl subcuticular closures.  Dermabond was applied to the skin.  The patient tolerated the procedure well and without complications.  The patient was able to be extubated and transferred to the recovery unit in satisfactory condition.  Pryor Curia MD

## 2020-06-13 NOTE — Anesthesia Procedure Notes (Signed)
Procedure Name: Intubation Date/Time: 06/13/2020 11:00 AM Performed by: Claudia Desanctis, CRNA Pre-anesthesia Checklist: Patient identified, Emergency Drugs available, Suction available and Patient being monitored Patient Re-evaluated:Patient Re-evaluated prior to induction Oxygen Delivery Method: Circle system utilized Preoxygenation: Pre-oxygenation with 100% oxygen Induction Type: IV induction Ventilation: Mask ventilation without difficulty and Oral airway inserted - appropriate to patient size Laryngoscope Size: 2 and Miller Grade View: Grade II Tube type: Oral Number of attempts: 1 Airway Equipment and Method: Stylet Placement Confirmation: ETT inserted through vocal cords under direct vision, positive ETCO2 and breath sounds checked- equal and bilateral Secured at: 21 cm Tube secured with: Tape Dental Injury: Teeth and Oropharynx as per pre-operative assessment  Difficulty Due To: Difficult Airway- due to large tongue

## 2020-06-13 NOTE — Discharge Instructions (Signed)

## 2020-06-13 NOTE — Transfer of Care (Signed)
Immediate Anesthesia Transfer of Care Note  Patient: Margaret [name]  Procedure(s) Performed: XI ROBOTIC ASSITED LAPAROSCOPIC PARTIAL NEPHRECTOMY (Right)  Patient Location: PACU  Anesthesia Type:General  Level of Consciousness: awake and patient cooperative  Airway & Oxygen Therapy: Patient Spontanous Breathing and Patient connected to face mask  Post-op Assessment: Report given to RN and Post -op Vital signs reviewed and stable  Post vital signs: Reviewed and stable  Last Vitals:  Vitals Value Taken Time  BP 155/74 06/13/20 1401  Temp    Pulse 60 06/13/20 1403  Resp 8 06/13/20 1403  SpO2 100 % 06/13/20 1403  Vitals shown include unvalidated device data.  Last Pain:  Vitals:   06/13/20 0959  TempSrc:   PainSc: 0-No pain      Patients Stated Pain Goal: 3 (04/79/98 7215)  Complications: No notable events documented.

## 2020-06-13 NOTE — Anesthesia Postprocedure Evaluation (Signed)
Anesthesia Post Note  Patient: Margaret [name]  Procedure(s) Performed: XI ROBOTIC ASSITED LAPAROSCOPIC PARTIAL NEPHRECTOMY (Right)     Patient location during evaluation: PACU Anesthesia Type: General Level of consciousness: awake and alert Pain management: pain level controlled Vital Signs Assessment: post-procedure vital signs reviewed and stable Respiratory status: spontaneous breathing, nonlabored ventilation, respiratory function stable and patient connected to nasal cannula oxygen Cardiovascular status: blood pressure returned to baseline and stable Postop Assessment: no apparent nausea or vomiting Anesthetic complications: no   No notable events documented.  Last Vitals:  Vitals:   06/13/20 1515 06/13/20 1530  BP: (!) 143/82 (!) 157/83  Pulse: (!) 57 (!) 58  Resp: 10 10  Temp:  (!) 36.3 C  SpO2: 97% 96%    Last Pain:  Vitals:   06/13/20 1530  TempSrc:   PainSc: Asleep                 Les Longmore S

## 2020-06-14 ENCOUNTER — Encounter (HOSPITAL_COMMUNITY): Payer: Self-pay | Admitting: Urology

## 2020-06-14 DIAGNOSIS — C641 Malignant neoplasm of right kidney, except renal pelvis: Secondary | ICD-10-CM | POA: Diagnosis not present

## 2020-06-14 LAB — BASIC METABOLIC PANEL
Anion gap: 10 (ref 5–15)
BUN: 23 mg/dL (ref 8–23)
CO2: 25 mmol/L (ref 22–32)
Calcium: 7.4 mg/dL — ABNORMAL LOW (ref 8.9–10.3)
Chloride: 102 mmol/L (ref 98–111)
Creatinine, Ser: 1.71 mg/dL — ABNORMAL HIGH (ref 0.44–1.00)
GFR, Estimated: 32 mL/min — ABNORMAL LOW (ref >60)
Glucose, Bld: 149 mg/dL — ABNORMAL HIGH (ref 70–99)
Potassium: 4.2 mmol/L (ref 3.5–5.1)
Sodium: 137 mmol/L (ref 135–145)

## 2020-06-14 LAB — HEMOGLOBIN AND HEMATOCRIT, BLOOD
HCT: 38.4 % (ref 36.0–46.0)
Hemoglobin: 11.8 g/dL — ABNORMAL LOW (ref 12.0–15.0)

## 2020-06-14 LAB — CREATININE, FLUID (PLEURAL, PERITONEAL, JP DRAINAGE): Creat, Fluid: 1.8 mg/dL

## 2020-06-14 MED ORDER — OXYCODONE HCL 5 MG PO TABS: 5.0000 mg | ORAL_TABLET | ORAL | 0 refills | Status: AC | PRN

## 2020-06-14 MED ORDER — ACETAMINOPHEN 325 MG PO TABS
650.0000 mg | ORAL_TABLET | Freq: Four times a day (QID) | ORAL | Status: DC | PRN
  Administered 2020-06-16: 650 mg via ORAL
  Filled 2020-06-14: qty 2

## 2020-06-14 MED ORDER — OXYCODONE HCL 5 MG PO TABS
5.0000 mg | ORAL_TABLET | ORAL | Status: DC | PRN
  Administered 2020-06-15 – 2020-06-16 (×3): 5 mg via ORAL
  Filled 2020-06-14 (×3): qty 1

## 2020-06-14 MED ORDER — HYDROCODONE-ACETAMINOPHEN 5-325 MG PO TABS
1.0000 | ORAL_TABLET | Freq: Four times a day (QID) | ORAL | Status: DC | PRN
  Administered 2020-06-14: 2 via ORAL
  Filled 2020-06-14: qty 2

## 2020-06-14 MED ORDER — ENOXAPARIN SODIUM 40 MG/0.4ML IJ SOSY
40.0000 mg | PREFILLED_SYRINGE | INTRAMUSCULAR | Status: DC
  Administered 2020-06-14 – 2020-06-17 (×4): 40 mg via SUBCUTANEOUS
  Filled 2020-06-14 (×4): qty 0.4

## 2020-06-14 MED ORDER — BISACODYL 10 MG RE SUPP
10.0000 mg | Freq: Once | RECTAL | Status: AC
  Administered 2020-06-14: 10 mg via RECTAL
  Filled 2020-06-14: qty 1

## 2020-06-14 NOTE — Care Management Obs Status (Signed)
[facility] NOTIFICATION   Patient Details  Name: Milissa Fesperman MRN: [id number] Date of Birth: 11/03/1950   Medicare Observation Status Notification Given:  Yes    MahabirJuliann Pulse, RN 06/14/2020, 4:53 PM

## 2020-06-14 NOTE — Evaluation (Signed)
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 pt is s/p R partial  nephrectomy per Dr. Alinda Money 06/13/20  Clinical Impression  Pt admitted with above diagnosis.  Pt able to sit EOB and transfer to chair with min assist, requiring excessive amount of time to complete tasks d/t pain. Recommend HHPT unless progress slower then may need SNF. Pt states she is planning to d/c to her friend's home in [place], friend works days. Will continue to follow in acute setting  Pt currently with functional limitations due to the deficits listed below (see PT Problem List). Pt will benefit from skilled PT to increase their independence and safety with mobility to allow discharge to the venue listed below.       Follow Up Recommendations Home health PT (if progress slow, may need SNF)    Equipment Recommendations  Rolling walker with 5" wheels    Recommendations for Other Services       Precautions / Restrictions Precautions Precautions: Fall Precaution Comments: R JP drain Restrictions Weight Bearing Restrictions: No      Mobility  Bed Mobility Overal bed mobility: Needs Assistance Bed Mobility: Supine to Sit;Sidelying to Sit;Rolling Rolling: Min assist Sidelying to sit: [facility] elevated;Min assist       General bed mobility comments: incr time for all aspects. effortful, assist to move LEs toward EOB, bed pad used to assist scooting hip sto EOB    Transfers Overall transfer level: Needs assistance Equipment used: Rolling walker (2 wheeled) Transfers: Sit to/from [facility] Sit to Stand: Min assist Stand pivot transfers: Min assist       General transfer comment: cues for hand placement, to power up with LEs, incr time  needed  Ambulation/Gait             General Gait Details: pt declined d/t pain  Stairs            Wheelchair Mobility    Modified Rankin (Stroke Patients Only)       Balance Overall balance assessment: Needs assistance Sitting-balance support: Bilateral upper extremity supported;Single extremity supported;Feet supported Sitting balance-Leahy Scale: Poor Sitting balance - Comments: unable to wt shift d/t pain     Standing balance-Leahy Scale: Poor Standing balance comment: reliant on UEs                             Pertinent Vitals/Pain Pain Assessment: Faces Faces Pain Scale: Hurts worst Pain Location: R flank/abd/back Pain Descriptors / Indicators: Grimacing;Sore Pain Intervention(s): Premedicated before session;Limited activity within patient's tolerance;Monitored during session;Repositioned    Home Living Family/patient expects to be discharged to:: Private residence Living Arrangements: Alone   Type of Home: House       Home Layout: One level Home Equipment: None Additional Comments: pt states she is planning to d/c to her friends her home in [place]    Prior Function Level of Independence: Independent               Hand Dominance        Extremity/Trunk Assessment   Upper Extremity Assessment Upper Extremity Assessment: Overall WFL for tasks assessed    Lower Extremity Assessment Lower Extremity Assessment: Generalized weakness (limited by pain)  Communication   Communication: No difficulties  Cognition Arousal/Alertness: Awake/alert Behavior During Therapy: WFL for tasks assessed/performed Overall Cognitive Status: Within Functional Limits for tasks assessed                                        General Comments      Exercises     Assessment/Plan    PT Assessment Patient needs continued PT services  PT Problem List Decreased strength;Decreased mobility;Decreased balance;Decreased  activity tolerance;Pain;Decreased knowledge of use of DME       PT Treatment Interventions DME instruction;Therapeutic exercise;Gait training;Functional mobility training;Therapeutic activities;Patient/family education    PT Goals (Current goals can be found in the Care Plan section)  Acute Rehab PT Goals Patient Stated Goal: less pain PT Goal Formulation: With patient Time For Goal Achievement: 06/28/20 Potential to Achieve Goals: Good    Frequency Min 3X/week   Barriers to discharge        Co-evaluation               AM-PAC PT "6 Clicks" Mobility  Outcome Measure Help needed turning from your back to your side while in a flat bed without using bedrails?: A Lot Help needed moving from lying on your back to sitting on the side of a flat bed without using bedrails?: A Lot Help needed moving to and from a bed to a chair (including a wheelchair)?: A Lot Help needed standing up from a chair using your arms (e.g., wheelchair or bedside chair)?: A Lot Help needed to walk in hospital room?: Total Help needed climbing 3-5 steps with a railing? : Total 6 Click Score: 10    End of Session   Activity Tolerance: Patient tolerated treatment well Patient left: in chair;with call bell/phone within reach;with chair alarm set Nurse Communication: Mobility status PT Visit Diagnosis: Other abnormalities of gait and mobility (R26.89)    Time: 9935-7017 PT Time Calculation (min) (ACUTE ONLY): 24 min   Charges:   PT Evaluation $PT Eval Low Complexity: 1 Low PT Treatments $Therapeutic Activity: 8-22 mins        Baxter Flattery, PT  Acute Rehab Dept ([place]) [phone] Pager [phone]  06/14/2020   [facility] 06/14/2020, 12:28 PM

## 2020-06-14 NOTE — Care Management CC44 (Signed)
Condition Code 44 Documentation Completed  Patient Details  Name: Margaret [name] MRN: [id number] Date of Birth: December 22, 1950   Condition Code 44 given:  Yes Patient signature on Condition Code 44 notice:  Yes Documentation of 2 MD's agreement:  Yes Code 44 added to claim:  Yes    Dessa Phi, RN 06/14/2020, 4:53 PM

## 2020-06-14 NOTE — Progress Notes (Signed)
Patient ID: Margaret [name], female   DOB: December 02, 1950, 70 y.o.   MRN: [id number]  1 Day Post-Op Subjective: Pt with expected pain at incision sites.  Minimal nausea.  No flatus.  Objective: Vital signs in last 24 hours: Temp:  [97.4 F (36.3 C)-98.7 F (37.1 C)] 97.9 F (36.6 C) (06/10 0456) Pulse Rate:  [57-87] 67 (06/10 0456) Resp:  [9-18] 18 (06/10 0456) BP: (133-176)/(74-101) 133/82 (06/10 0456) SpO2:  [95 %-100 %] 95 % (06/10 0456) Weight:  [89.4 kg] 89.4 kg (06/09 0959)  Intake/Output from previous day: 06/09 0701 - 06/10 0700 In: 3749.4 [I.V.:3399.4; IV Piggyback:350] Out: 670 [Urine:500; Drains:120; Blood:50] Intake/Output this shift: No intake/output data recorded.  Physical Exam:  General: Alert and oriented CV: RRR Lungs: Clear Abdomen: Soft, ND, Minimal BS Incisions: C/D/I, drain in place with minimal output Ext: NT, No erythema  Lab Results: Recent Labs    06/13/20 1435 06/14/20 0511  HGB 12.9 11.8*  HCT 41.3 38.4   BMET Recent Labs    06/13/20 1435 06/14/20 0511  NA 142 137  K 4.3 4.2  CL 104 102  CO2 27 25  GLUCOSE 127* 149*  BUN 22 23  CREATININE 1.69* 1.71*  CALCIUM 8.2* 7.4*     Studies/Results: No results found.  Assessment/Plan: POD # 1 s/p right RAL partial nephrectomy - Ambulate, IS, will ask PT to see to assist with ambulation and recommendations for disposition - Advance diet as tolerated - D/C Foley - Decrease IVF - Oral pain medicaion - Dulcolax suppository - Follow renal function - Path pending - Anticipate discharge probably tomorrow   LOS: 0 days   Dutch Gray 06/14/2020, 7:09 AM

## 2020-06-14 NOTE — TOC Progression Note (Signed)
Transition of Care [facility]) - Progression Note    Patient Details  Name: Margaret [name] MRN: [id number] Date of Birth: December 20, 1950  Transition of Care [facility]) CM/SW Contact  Jodee Wagenaar, Juliann Pulse, RN Phone Number: 06/14/2020, 4:57 PM  Clinical Narrative:  spoke to patient about d/c plans-HHPT-patient declines HHC, has pcp,pharmacy,own transport home.          Expected Discharge Plan and Services                                                 Social Determinants of Health (SDOH) Interventions    Readmission Risk Interventions No flowsheet data found.

## 2020-06-15 DIAGNOSIS — C641 Malignant neoplasm of right kidney, except renal pelvis: Secondary | ICD-10-CM | POA: Diagnosis not present

## 2020-06-15 LAB — BASIC METABOLIC PANEL
Anion gap: 12 (ref 5–15)
BUN: 25 mg/dL — ABNORMAL HIGH (ref 8–23)
CO2: 24 mmol/L (ref 22–32)
Calcium: 6.9 mg/dL — ABNORMAL LOW (ref 8.9–10.3)
Chloride: 103 mmol/L (ref 98–111)
Creatinine, Ser: 1.51 mg/dL — ABNORMAL HIGH (ref 0.44–1.00)
GFR, Estimated: 37 mL/min — ABNORMAL LOW (ref >60)
Glucose, Bld: 128 mg/dL — ABNORMAL HIGH (ref 70–99)
Potassium: 3.7 mmol/L (ref 3.5–5.1)
Sodium: 139 mmol/L (ref 135–145)

## 2020-06-15 LAB — HEMOGLOBIN AND HEMATOCRIT, BLOOD
HCT: 35 % — ABNORMAL LOW (ref 36.0–46.0)
Hemoglobin: 11.1 g/dL — ABNORMAL LOW (ref 12.0–15.0)

## 2020-06-16 DIAGNOSIS — C641 Malignant neoplasm of right kidney, except renal pelvis: Secondary | ICD-10-CM | POA: Diagnosis not present

## 2020-06-16 NOTE — Progress Notes (Signed)
Patient ID: Margaret [name], female   DOB: 1950-06-13, 71 y.o.   MRN: [id number]  3 Days Post-Op Subjective: Pain controlled Tolerating diet Not ambulating well independently   Objective: Vital signs in last 24 hours: Temp:  [98.2 F (36.8 C)-100.4 F (38 C)] 98.2 F (36.8 C) (06/12 1304) Pulse Rate:  [59-71] 62 (06/12 1304) Resp:  [16-18] 16 (06/12 1304) BP: (95-167)/(58-76) 122/72 (06/12 1304) SpO2:  [95 %-98 %] 98 % (06/12 1304) Weight:  [95.6 kg] 95.6 kg (06/12 0613)  Intake/Output from previous day: 06/11 0701 - 06/12 0700 In: 2180.9 [P.O.:480; I.V.:1700.9] Out: 825 [Urine:800; Drains:25] Intake/Output this shift: No intake/output data recorded.  Physical Exam:  General: Alert and oriented CV: RRR Lungs: Clear Abdomen: Soft, ND, Minimal BS Incisions: C/D/I, drain in place with minimal output Ext: NT, No erythema  Lab Results: Recent Labs    06/14/20 0511 06/15/20 0551  HGB 11.8* 11.1*  HCT 38.4 35.0*   BMET Recent Labs    06/14/20 0511 06/15/20 0551  NA 137 139  K 4.2 3.7  CL 102 103  CO2 25 24  GLUCOSE 149* 128*  BUN 23 25*  CREATININE 1.71* 1.51*  CALCIUM 7.4* 6.9*     Studies/Results: No results found.  Assessment/Plan: POD #3 s/p right RAL partial nephrectomy - Ambulate, IS,  - PT saw on Friday for assist with ambulation and recommendations for disposition, however patient is now refusing HH - Advance diet as tolerated - Oral pain medicaion - Dulcolax suppository - Follow renal function - Path pending - Anticipate discharge probably tomorrow now that patient is refusing HH, no longer staying with friends and cannot ambulate more than a few feet or upstairs on her own. There will need to be another PT evaluation/assistance and patient endorses possibly interested in nursing or rehab if necessary since she will not allow someone into her home.    LOS: 1 day   Simra Fiebig 06/16/2020, 3:17 PM

## 2020-06-16 NOTE — Progress Notes (Signed)
Pt's VS rechecked with improvement in BP 122/72 and 62. Pt remains up in chair and reports that she stills light headed but its some better. Maintain current plan of care for pt and monitor Pt closely

## 2020-06-16 NOTE — Progress Notes (Signed)
Pt with c/o feeling lightheaded after ambulation and sitting in chair. BP 92/59 and Pulse 59. No other acute changes noted in assessment at this time. Monitor Pt closely

## 2020-06-16 NOTE — Discharge Summary (Deleted)
[facility] Discharge Summary  Admit date: 06/13/2020  Discharge date and time: 06/16/20   Discharge to: Home  Discharge Service: Urology  Discharge Attending Physician:  Alyson Ingles ([facility] primary)  Discharge  Diagnoses: renal mass  Secondary Diagnosis: Active Problems:   Neoplasm of right kidney   OR Procedures: Procedure(s): XI ROBOTIC ASSITED LAPAROSCOPIC PARTIAL NEPHRECTOMY 06/13/2020   Ancillary Procedures: None   Discharge Day Services: The patient was seen and examined by the Urology team both in the morning and immediately prior to discharge.  Vital signs and laboratory values were stable and within normal limits.  The physical exam was benign and unchanged and all surgical wounds were examined.  Discharge instructions were explained and all questions answered.  Subjective  No acute events overnight. Pain Controlled. No fever or chills.  Objective Patient Vitals for the past 8 hrs:  BP Temp Pulse Resp SpO2 Weight  06/16/20 0613 126/68 100.1 F (37.8 C) 71 18 95 % 95.6 kg   No intake/output data recorded.  General Appearance:        No acute distress Lungs:                       Normal work of breathing on room air Heart:                                Regular rate and rhythm Abdomen:                         Soft, non-tender, non-distended, incisions c/d/I. Drain removed and drain site dressed. Extremities:                      Warm and well perfused   Hospital Course:  The patient underwent Right robotic partial nephrectomy on 06/13/2020.  The patient tolerated the procedure well, was extubated in the OR, and afterwards was taken to the PACU for routine post-surgical care. When stable the patient was transferred to the floor.   The patient did well postoperatively. PT was consulted in order to encourage patient getting up and home health was recommended.  The patient's diet was slowly advanced and at the time of discharge was tolerating a regular diet.  The patient  was discharged home 3 Days Post-Op, at which point was tolerating a regular solid diet, was able to void spontaneously, have adequate pain control with P.O. pain medication, and could ambulate without difficulty. The patient will follow up with [place] for post op check.   Condition at Discharge: Improved  Discharge Medications:  Allergies as of 06/16/2020       Reactions   Latex Itching   Nsaids Other (See Comments)   Contraindicated due to Kidney Disease   Penicillins Rash   Patient tolerated Rocephin without adverse reactions noted.        Medication List     STOP taking these medications    aspirin 81 MG chewable tablet   vitamin B-12 1000 MCG tablet Commonly known as: CYANOCOBALAMIN   Vitamin D3 Super Strength 50 MCG (2000 UT) Caps Generic drug: Cholecalciferol       TAKE these medications    acetaminophen 500 MG tablet Commonly known as: TYLENOL Take 500 mg by mouth every 6 (six) hours as needed for moderate pain or headache.   amLODipine 10 MG tablet Commonly known as: NORVASC Take 1 tablet (10 mg  total) by mouth daily. For HTN What changed: when to take this   atorvastatin 20 MG tablet Commonly known as: LIPITOR Take 1 tablet (20 mg total) by mouth daily. What changed: when to take this   calcitRIOL 0.5 MCG capsule Commonly known as: ROCALTROL Take 1 capsule (0.5 mcg total) by mouth 2 (two) times daily. FOR HYPOCALCEMIA   carvedilol 25 MG tablet Commonly known as: COREG Take 1 tablet (25 mg total) by mouth 2 (two) times daily with a meal.   docusate sodium 100 MG capsule Commonly known as: COLACE Take 1 capsule (100 mg total) by mouth 2 (two) times daily.   furosemide 20 MG tablet Commonly known as: LASIX Take 1 tablet (20 mg total) by mouth daily as needed (For Lower Leg Swelling).   hydrALAZINE 100 MG tablet Commonly known as: APRESOLINE Take 1 tablet (100 mg total) by mouth 3 (three) times daily.   levothyroxine 200 MCG tablet Commonly known  as: SYNTHROID Take 200 mcg by mouth daily at 6 (six) AM.   lisinopril 40 MG tablet Commonly known as: ZESTRIL Take 1 tablet (40 mg total) by mouth daily. What changed: when to take this   NON FORMULARY DIET: REGULAR, NAS/CCD   oxyCODONE 5 MG immediate release tablet Commonly known as: Oxy IR/ROXICODONE Take 1 tablet (5 mg total) by mouth every 4 (four) hours as needed for moderate pain.

## 2020-06-17 DIAGNOSIS — C641 Malignant neoplasm of right kidney, except renal pelvis: Secondary | ICD-10-CM | POA: Diagnosis not present

## 2020-06-17 LAB — SURGICAL PATHOLOGY

## 2020-06-17 NOTE — Progress Notes (Signed)
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 pt is s/p R partial  nephrectomy per Dr. Alinda Money 06/13/20    PT Comments    Pt ambulates 40 ft with RW, min guard taking slow, steady steps using RW and limited by pain. Pt requires min guard-min assist with bed mobility due to limitations from pain. Pt no longer going to stay with friends in [place], and doesn't have anyone to come stay with her. Recommending SNF for ST strengthening prior to return home alone.   Follow Up Recommendations  SNF     Equipment Recommendations  Rolling walker with 5" wheels    Recommendations for Other Services       Precautions / Restrictions Precautions Precautions: Fall Restrictions Weight Bearing Restrictions: No    Mobility  Bed Mobility Overal bed mobility: Needs Assistance Bed Mobility: Supine to Sit;Sit to Supine  Supine to sit: Min guard Sit to supine: Min assist   General bed mobility comments: min guard for supine to sit, min A to lift BLE Back into bed, increased time and use of bedrail due to pain    Transfers Overall transfer level: Needs assistance Equipment used: Rolling walker (2 wheeled) Transfers: Sit to/from Stand Sit to Stand: Min guard  General transfer comment: min guard to steady with power to stand  Ambulation/Gait Ambulation/Gait assistance: Min guard Gait Distance (Feet): 40 Feet Assistive device: Rolling walker (2 wheeled) Gait Pattern/deviations: Step-through pattern;Decreased stride length Gait velocity: decreased   General Gait Details: slow, steady, step through gait pattern with RW, limited by pain, no LOB   Stairs             Wheelchair Mobility    Modified Rankin (Stroke Patients Only)        Balance Overall balance assessment: Needs assistance Sitting-balance support: Feet supported Sitting balance-Leahy Scale: Fair Sitting balance - Comments: seated EOB   Standing balance support: During functional activity Standing balance-Leahy Scale: Fair Standing balance comment: able to release BUE in static, dynamic with RW         Cognition Arousal/Alertness: Awake/alert Behavior During Therapy: WFL for tasks assessed/performed Overall Cognitive Status: Within Functional Limits for tasks assessed    Exercises      General Comments        Pertinent Vitals/Pain Pain Assessment: Faces Faces Pain Scale: Hurts whole lot Pain Location: R flank/abd Pain Descriptors / Indicators: Grimacing;Guarding;Sore Pain Intervention(s): Limited activity within patient's tolerance;Monitored during session;Premedicated before session    Home Living                      Prior Function            PT Goals (current goals can now be found in the care plan section) Acute Rehab PT Goals Patient Stated Goal: less pain PT Goal Formulation: With patient Time For Goal Achievement: 06/28/20 Potential to Achieve Goals: Good Progress towards PT goals: Progressing toward goals    Frequency    Min 3X/week      PT Plan Discharge plan needs to be updated    Co-evaluation              AM-PAC PT "6 Clicks" Mobility   Outcome Measure  Help needed turning from your back to your side  while in a flat bed without using bedrails?: A Little Help needed moving from lying on your back to sitting on the side of a flat bed without using bedrails?: A Little Help needed moving to and from a bed to a chair (including a wheelchair)?: A Little Help needed standing up from a chair using your arms (e.g., wheelchair or bedside chair)?: A Little Help needed to walk in hospital room?: A Little Help needed climbing 3-5 steps with a railing? : A Lot 6 Click Score: 17    End of Session  Equipment Utilized During Treatment: Gait belt Activity Tolerance: Patient tolerated treatment well;Patient limited by pain Patient left: in bed;with call bell/phone within reach;with bed alarm set Nurse Communication: Mobility status PT Visit Diagnosis: Other abnormalities of gait and mobility (R26.89)     Time: 7915-0413 PT Time Calculation (min) (ACUTE ONLY): 18 min  Charges:  $Gait Training: 8-22 mins                      Tori Rui Wordell PT, DPT 06/17/20, 1:06 PM

## 2020-06-17 NOTE — Discharge Summary (Addendum)
Date of admission: 06/13/2020  Date of discharge: 06/18/2020  Admission diagnosis: Right renal neoplasm  Discharge diagnosis: Right renal neoplasm  Secondary diagnoses: Hypertension, hyperlipidemia, chronic kidney disease  History and Physical: For full details, please see admission history and physical. Briefly, Margaret [name] is a 70 y.o. year old patient with CKD and bilateral renal masses suspicious for malignancy.  Hospital Course: She underwent a right RAL partial nephrectomy on 06/13/20.  She tolerated this procedure well and remained hemodynamically stable overnight.  She had her diet advanced over the next two days and her pain medication was transitioned to oral medication.  Her drain fluid was consistent with serum and was removed on POD # 2.  She was evaluated by PT due to poor ability to ambulate on her own.  They had recommended home health PT initially but she did not progress and she did not have adequate help at home.  As such, she was discharged to a SNF on POD #5 for continued physical therapy until she could return to home independently.  Laboratory values:  Recent Labs    06/15/20 0551  HGB 11.1*  HCT 35.0*   Recent Labs    06/15/20 0551  CREATININE 1.51*    Disposition: Home  Discharge instruction: The patient was instructed to be ambulatory but told to refrain from heavy lifting, strenuous activity, or driving.   Discharge medications:  Allergies as of 06/17/2020       Reactions   Latex Itching   Nsaids Other (See Comments)   Contraindicated due to Kidney Disease   Penicillins Rash   Patient tolerated Rocephin without adverse reactions noted.        Medication List     STOP taking these medications    aspirin 81 MG chewable tablet   vitamin B-12 1000 MCG tablet Commonly known as: CYANOCOBALAMIN   Vitamin D3 Super Strength 50 MCG (2000 UT) Caps Generic drug: Cholecalciferol       TAKE these medications    acetaminophen 500 MG  tablet Commonly known as: TYLENOL Take 500 mg by mouth every 6 (six) hours as needed for moderate pain or headache.   amLODipine 10 MG tablet Commonly known as: NORVASC Take 1 tablet (10 mg total) by mouth daily. For HTN What changed: when to take this   atorvastatin 20 MG tablet Commonly known as: LIPITOR Take 1 tablet (20 mg total) by mouth daily. What changed: when to take this   calcitRIOL 0.5 MCG capsule Commonly known as: ROCALTROL Take 1 capsule (0.5 mcg total) by mouth 2 (two) times daily. FOR HYPOCALCEMIA   carvedilol 25 MG tablet Commonly known as: COREG Take 1 tablet (25 mg total) by mouth 2 (two) times daily with a meal.   docusate sodium 100 MG capsule Commonly known as: COLACE Take 1 capsule (100 mg total) by mouth 2 (two) times daily.   furosemide 20 MG tablet Commonly known as: LASIX Take 1 tablet (20 mg total) by mouth daily as needed (For Lower Leg Swelling).   hydrALAZINE 100 MG tablet Commonly known as: APRESOLINE Take 1 tablet (100 mg total) by mouth 3 (three) times daily.   levothyroxine 200 MCG tablet Commonly known as: SYNTHROID Take 200 mcg by mouth daily at 6 (six) AM.   lisinopril 40 MG tablet Commonly known as: ZESTRIL Take 1 tablet (40 mg total) by mouth daily. What changed: when to take this   NON FORMULARY DIET: REGULAR, NAS/CCD   oxyCODONE 5 MG immediate release tablet Commonly known  as: Oxy IR/ROXICODONE Take 1 tablet (5 mg total) by mouth every 4 (four) hours as needed for moderate pain.        Followup:   Follow-up Information     Raynelle Bring, MD Follow up on 07/16/2020.   Specialty: Urology Why: at 10:30 Contact information: [place] [phone]

## 2020-06-17 NOTE — Progress Notes (Signed)
Patient ID: Margaret [name], female   DOB: Jun 23, 1950, 70 y.o.   MRN: [id number]  4 Days Post-Op Subjective: Pt has continued to make slow progress over the weekend.  Her main issue remains independent ambulation.  She lives independently and does not have family at home with her or who can help her sufficiently at home.  Objective: Vital signs in last 24 hours: Temp:  [98.2 F (36.8 C)-98.8 F (37.1 C)] 98.8 F (37.1 C) (06/12 2037) Pulse Rate:  [59-72] 72 (06/12 2037) Resp:  [16-18] 18 (06/12 2037) BP: (95-139)/(58-75) 139/75 (06/12 2037) SpO2:  [96 %-98 %] 96 % (06/12 2037)  Intake/Output from previous day: 06/12 0701 - 06/13 0700 In: 120 [P.O.:120] Out: 500 [Urine:500] Intake/Output this shift: No intake/output data recorded.  Physical Exam:  General: Alert and oriented Abdomen: Soft, ND Incisions: C/D/I Ext: NT, No erythema  Lab Results: Recent Labs    06/15/20 0551  HGB 11.1*  HCT 35.0*   BMET Recent Labs    06/15/20 0551  NA 139  K 3.7  CL 103  CO2 24  GLUCOSE 128*  BUN 25*  CREATININE 1.51*  CALCIUM 6.9*     Studies/Results: Path pending.  Assessment/Plan: POD # 4 s/p right RAL partial nephrectomy - HHPT was recommended on Friday but no longer seems like a good options considering lack of support/help for her at home.  Will therefore pursue SNF options for continued PT/rehab.  She is otherwise medically stable for discharge home.   LOS: 1 day   Dutch Gray 06/17/2020, 7:18 AM

## 2020-06-17 NOTE — TOC Progression Note (Addendum)
Transition of Care [facility]) - Progression Note    Patient Details  Name: Margaret [name] MRN: [id number] Date of Birth: [birth year]  Transition of Care [facility]) CM/SW Contact  Velina Drollinger, Juliann Pulse, RN Phone Number: 06/17/2020, 11:34 AM  Clinical Narrative: Noted PT recc may need SNF-patient in agreement to SNF if recc-has gone to [facility] in past. Await PT recc after re eval.  4p-Fl2 completed-await bed offers & choice,then will start auth.    Expected Discharge Plan: [place] Barriers to Discharge: Continued Medical Work up  Expected Discharge Plan and Services Expected Discharge Plan: [place]   Discharge Planning Services: CM Consult   Living arrangements for the past 2 months: Single Family Home Expected Discharge Date: 06/16/20                                     Social Determinants of Health (SDOH) Interventions    Readmission Risk Interventions No flowsheet data found.

## 2020-06-17 NOTE — NC FL2 (Signed)
[place] LEVEL OF CARE SCREENING TOOL     IDENTIFICATION  Patient Name: Margaret [name] Birthdate: Oct 02, 1950 Sex: female Admission Date (Current Location): 06/13/2020  [facility] and [place] Number:  Herbalist and Address:  [facility],  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/12/2020   Bacteremia 01/12/2020   Acute renal failure superimposed on stage 3a chronic kidney disease ([place]) 01/11/2020   Ureterolithiasis 08/65/7846   Complicated UTI (urinary tract infection) 01/11/2020   Prolonged QT interval 01/11/2020   Obesity, Class III, BMI 40-49.9 (morbid obesity) ([place]) 01/11/2020   ARF (acute renal failure) ([place]) 01/11/2020   Osteoarthritis of right hip 12/24/2018   History of total right hip arthroplasty 12/24/2018   Acute kidney injury superimposed on chronic kidney disease ([place]) 12/24/2018   UTI (urinary tract infection) 12/24/2018   Leg swelling 05/29/2016   Hypokalemia 05/08/2016   Renal insufficiency 05/08/2016   Mixed hyperlipidemia 05/07/2016   Obesity (BMI 30-39.9) 05/07/2016   Prediabetes 05/07/2016   Hypertension 05/07/2016   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   Hypocalcemia 02/13/2015   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[place]) 02/13/2015    Orientation RESPIRATION BLADDER Height & Weight     Self, Time, Situation, Place  Normal Continent Weight: 95.6 kg Height:  5\' 1"  (154.9 cm)  BEHAVIORAL SYMPTOMS/MOOD NEUROLOGICAL BOWEL  NUTRITION STATUS      Continent Diet (Regular)  AMBULATORY STATUS COMMUNICATION OF NEEDS Skin   Limited Assist Verbally Surgical wounds (s/p nephrectomy surgical site.)                       Personal Care Assistance Level of Assistance  Bathing, Feeding, Dressing Bathing Assistance: Limited assistance Feeding assistance: Limited assistance Dressing Assistance: Limited assistance     Functional Limitations Info  Sight, Hearing, Speech Sight Info: Adequate Hearing Info: Adequate Speech Info: Adequate    SPECIAL CARE FACTORS FREQUENCY  PT (By licensed PT), OT (By licensed OT)     PT Frequency:  (5x week) OT Frequency:  (5x week)            Contractures Contractures Info: Not present    Additional Factors Info  Code Status, Allergies Code Status Info:  (Full) Allergies Info:  (Latex,NSAIDS,Penicillins)           Current Medications (06/17/2020):  This is the current hospital active medication list Current Facility-Administered Medications  Medication Dose Route Frequency Provider Last Rate Last Admin   acetaminophen (TYLENOL) tablet 650 mg  650 mg Oral Q6H PRN Raynelle Bring, MD   650 mg at 06/16/20 2031   amLODipine (NORVASC) tablet 10 mg  10 mg Oral Daily Raynelle Bring, MD   10 mg at 06/17/20 0931   atorvastatin (LIPITOR) tablet 20 mg  20 mg Oral Daily Raynelle Bring, MD   20 mg at 06/17/20 0930   carvedilol (COREG) tablet 25 mg  25 mg Oral BID WC Raynelle Bring, MD   25 mg at 06/17/20 0811   docusate sodium (COLACE)  capsule 100 mg  100 mg Oral BID Raynelle Bring, MD   100 mg at 06/17/20 0930   enoxaparin (LOVENOX) injection 40 mg  40 mg Subcutaneous Q24H Raynelle Bring, MD   40 mg at 06/16/20 2031   furosemide (LASIX) tablet 20 mg  20 mg Oral Daily PRN Raynelle Bring, MD       hydrALAZINE (APRESOLINE) tablet 100 mg  100 mg Oral TID Raynelle Bring, MD   100 mg at 06/17/20 0931   levothyroxine (SYNTHROID) tablet 200 mcg  200 mcg Oral Q0600 Raynelle Bring, MD    200 mcg at 06/17/20 0559   lisinopril (ZESTRIL) tablet 40 mg  40 mg Oral Daily Raynelle Bring, MD   40 mg at 06/17/20 0931   oxyCODONE (Oxy IR/ROXICODONE) immediate release tablet 5 mg  5 mg Oral Q4H PRN Raynelle Bring, MD   5 mg at 06/16/20 1752     Discharge Medications: Please see discharge summary for a list of discharge medications.  Relevant Imaging Results:  Relevant Lab Results:   Additional Information SS#[id number] [place], [place]

## 2020-06-18 DIAGNOSIS — C641 Malignant neoplasm of right kidney, except renal pelvis: Secondary | ICD-10-CM | POA: Diagnosis not present

## 2020-06-18 LAB — RESP PANEL BY RT-PCR (FLU A&B, COVID) ARPGX2
Influenza A by PCR: NEGATIVE
Influenza B by PCR: NEGATIVE
SARS Coronavirus 2 by RT PCR: NEGATIVE

## 2020-06-18 NOTE — Progress Notes (Signed)
Patient discharged to SNF- [facility], transported to the facility by a friend in personal car per patient request, report called to facility and discharge packet given to patient/friend for the facility. Surgical incision clean/dry/intact, no sign of infection noted. Patient denies nay pain/distress.

## 2020-06-18 NOTE — TOC Progression Note (Signed)
Transition of Care [facility]) - Progression Note    Patient Details  Name: Margaret [name] MRN: [id number] Date of Birth: Nov 06, 1950  [facility]) CM/SW Contact  Tamkia Temples, Juliann Pulse, RN Phone Number: 06/18/2020, 9:56 AM  Clinical Narrative:  Laurell Josephs accepted-initiated Thea Silversmith XQ#[id number] Josem Kaufmann.    Expected Discharge Plan: Skilled Nursing Facility Barriers to Discharge: Insurance Authorization  Expected Discharge Plan and Services Expected Discharge Plan: [place]   Discharge Planning Services: CM Consult   Living arrangements for the past 2 months: Single Family Home Expected Discharge Date: 06/18/20                                     Social Determinants of Health (SDOH) Interventions    Readmission Risk Interventions No flowsheet data found.

## 2020-06-18 NOTE — TOC Transition Note (Addendum)
Transition of Care [facility]) - CM/SW Discharge Note   Patient Details  Name: Margaret [name] MRN: [id number] Date of Birth: 1950/10/02  Transition of Care [facility]) CM/SW Contact:  Dessa Phi, RN Phone Number: 06/18/2020, 12:01 PM   Clinical Narrative:   Aida Puffer RT#[id number].covid to be ordered. Patient can be d/c pending covid results per [facility]. Colman Cater to rm#[id number],nsg call report tel#[phone]. [facility] for transport. 12:43p-Patient has declined [facility] transport-wants her family member to pick her up-CM spoke to him on phone-he agrees to transport to [facility] [employer] informed. [facility] called to cancel. No further CM needs.    Final next level of care: Skilled Nursing Facility Barriers to Discharge: No Barriers Identified   Patient Goals and CMS Choice Patient states their goals for this hospitalization and ongoing recovery are:: go to rehab if reccommended CMS Medicare.gov Compare Post Acute Care list provided to:: Patient Represenative (must comment) Choice offered to / list presented to : Patient  Discharge Placement PASRR number recieved: 06/17/20            Patient chooses bed at: [facility] Patient to be transferred to facility by: [facility] Name of family member notified: Patient Patient and family notified of of transfer: 06/18/20  Discharge Plan and Services   Discharge Planning Services: CM Consult                                 Social Determinants of Health (SDOH) Interventions     Readmission Risk Interventions No flowsheet data found.

## 2020-11-01 ENCOUNTER — Other Ambulatory Visit: Payer: Self-pay | Admitting: Urology

## 2020-11-01 DIAGNOSIS — C641 Malignant neoplasm of right kidney, except renal pelvis: Secondary | ICD-10-CM

## 2020-11-01 DIAGNOSIS — D49512 Neoplasm of unspecified behavior of left kidney: Secondary | ICD-10-CM

## 2020-12-08 ENCOUNTER — Ambulatory Visit
Admission: RE | Admit: 2020-12-08 | Discharge: 2020-12-08 | Disposition: A | Discharge status: Home / Self Care | Payer: Medicare PPO | Source: Ambulatory Visit | Attending: Urology | Admitting: Urology

## 2020-12-08 DIAGNOSIS — C641 Malignant neoplasm of right kidney, except renal pelvis: Secondary | ICD-10-CM

## 2020-12-08 DIAGNOSIS — D49512 Neoplasm of unspecified behavior of left kidney: Secondary | ICD-10-CM

## 2020-12-08 MED ORDER — GADOBENATE DIMEGLUMINE 529 MG/ML IV SOLN
18.0000 mL | Freq: Once | INTRAVENOUS | Status: AC | PRN
  Administered 2020-12-08: 18 mL via INTRAVENOUS

## 2021-05-16 ENCOUNTER — Other Ambulatory Visit: Payer: Self-pay | Admitting: Urology

## 2021-05-16 DIAGNOSIS — C641 Malignant neoplasm of right kidney, except renal pelvis: Secondary | ICD-10-CM

## 2021-08-06 ENCOUNTER — Ambulatory Visit
Admission: RE | Admit: 2021-08-06 | Discharge: 2021-08-06 | Disposition: A | Discharge status: Home / Self Care | Payer: Medicare PPO | Source: Ambulatory Visit | Attending: Urology | Admitting: Urology

## 2021-08-06 DIAGNOSIS — C641 Malignant neoplasm of right kidney, except renal pelvis: Secondary | ICD-10-CM

## 2021-08-06 MED ORDER — GADOBENATE DIMEGLUMINE 529 MG/ML IV SOLN
15.0000 mL | Freq: Once | INTRAVENOUS | Status: AC | PRN
  Administered 2021-08-06: 15 mL via INTRAVENOUS

## 2021-12-23 IMAGING — MR MR ABDOMEN WO/W CM
11 of 16 series · 26 of 48 positions shown · IV contrast (multihance)
Comparison: 04/09/2020

CLINICAL DATA: Follow-up renal cysts, status post right partial
nephrectomy for cystic papillary renal cell carcinoma

EXAM:
MRI ABDOMEN WITHOUT AND WITH CONTRAST
TECHNIQUE: Multiplanar multisequence MR imaging of the abdomen was performed
both before and after the administration of intravenous contrast.
CONTRAST:  18mL MULTIHANCE GADOBENATE DIMEGLUMINE 529 MG/ML IV SOLN

[Series 3: cor haste · coronal · 5.0mm · 0.68mm/px · 1 of 32 slices shown]
[im 1/32]
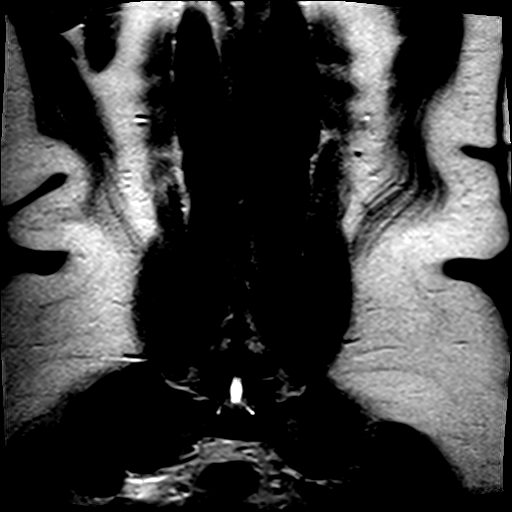

[Series 4: axial haste · axial · 6.0mm · 0.68mm/px · 1 of 33 slices shown]
[im 1/33]
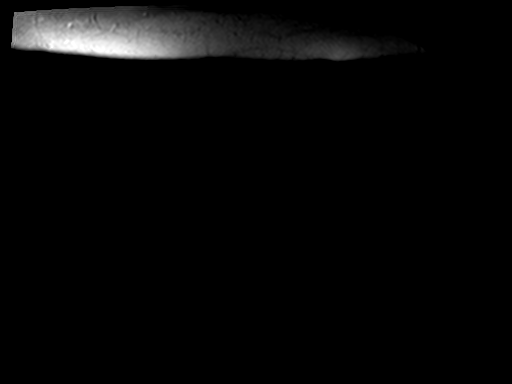

[Series 5: T1 · axial · 6.0mm · 0.68mm/px · z∈[-54,+157]mm · 2 of 66 slices shown]
[im 1/66]
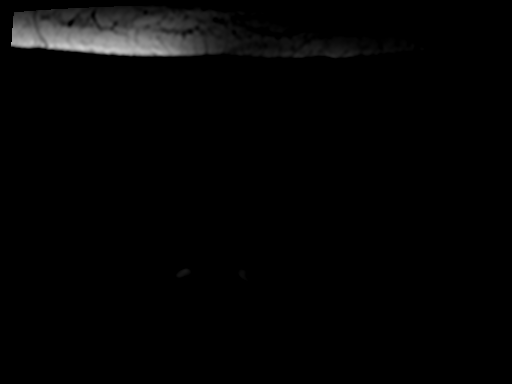
[im 66/66]
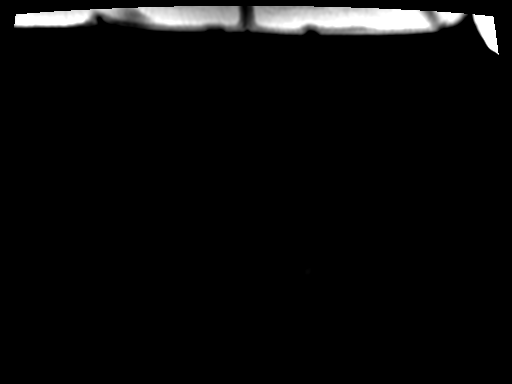

[Series 6: bSSFP · axial · 4.0mm · 0.68mm/px · z∈[-37,+162]mm · 2 of 51 slices shown]
[im 1/51]
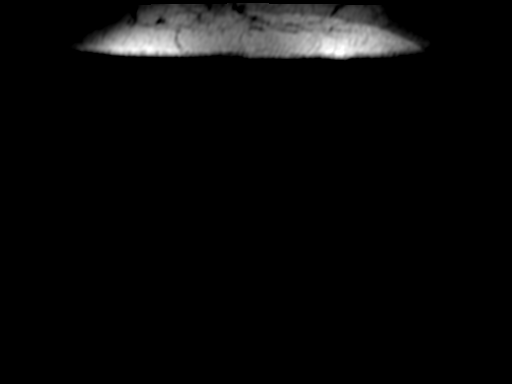
[im 51/51]
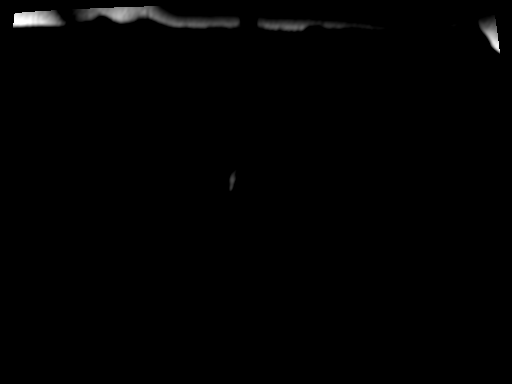

[Series 7: T2 fat-sat · axial · 6.0mm · 1.09mm/px · 1 of 31 slices shown]
[im 1/31]
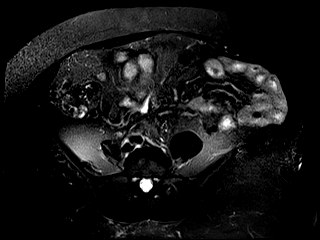

[Series 8: ep2d_[id number]_[id number]_trig · axial · 6.0mm · 1.82mm/px · z∈[-45,+178]mm · 4 of 96 slices shown]
[im 1/96]
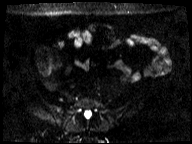
[im 32/96]
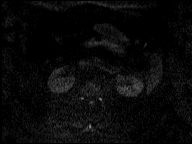
[im 64/96]
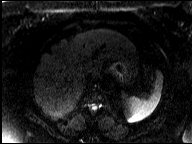
[im 96/96]
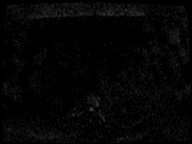

[Series 9: ep2d_[id number]_800_p2_trig_adc · axial · 6.0mm · 1.82mm/px · 1 of 32 slices shown]
[im 1/32]
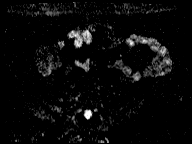

[Series 10: T1 dynamic · axial · non-contrast · 2.5mm · 0.74mm/px · z∈[-34,+163]mm · 4 of 80 slices shown]
[im 1/80]
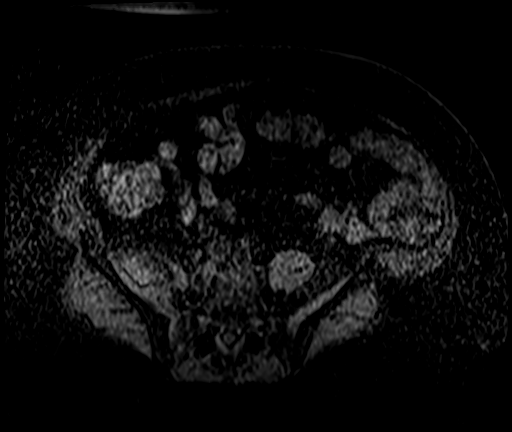
[im 27/80]
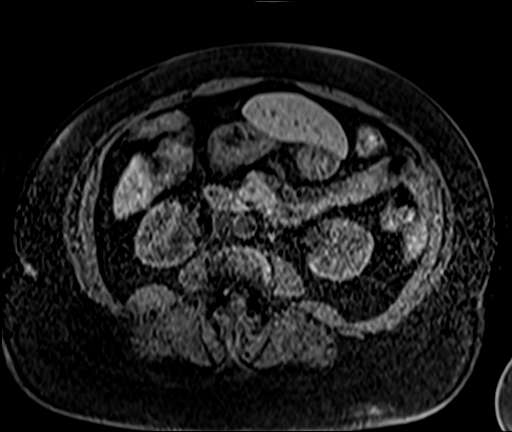
[im 53/80]
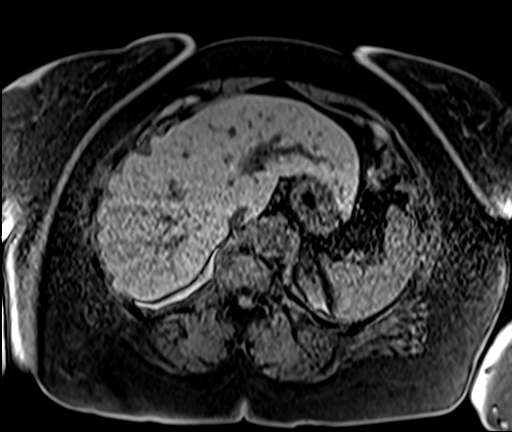
[im 80/80]
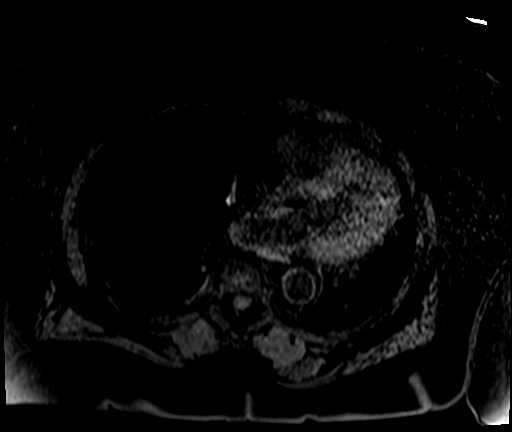

[Series 11: T1 dynamic post-contrast · axial · 2.5mm · 0.74mm/px · z∈[-34,+163]mm · 4 of 80 slices shown (1 of 3)]
[im 1/80]
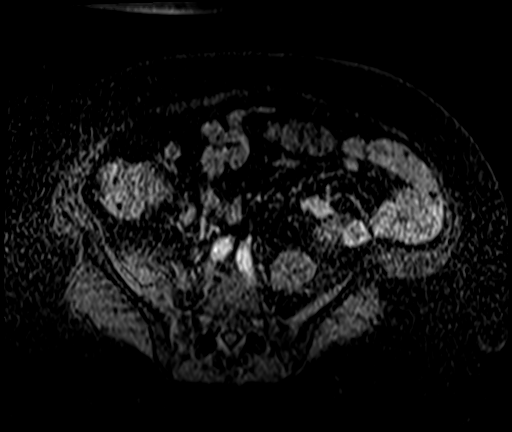
[im 27/80]
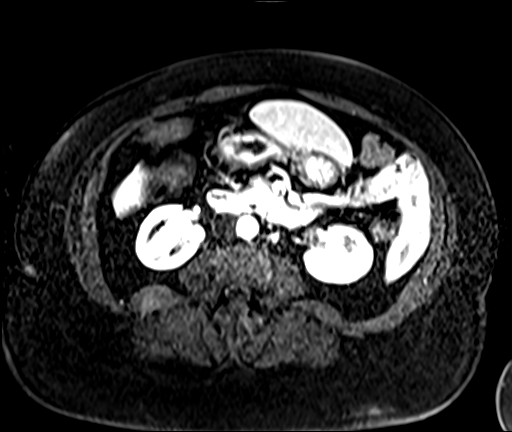
[im 53/80]
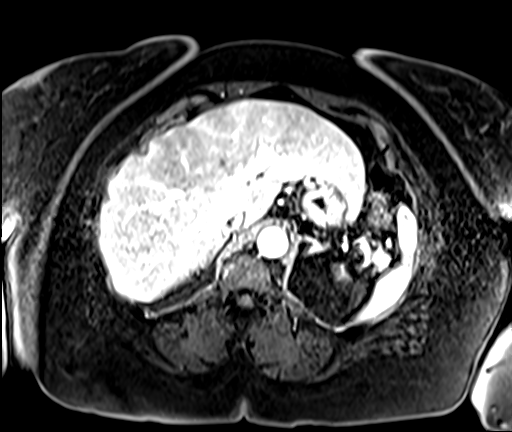
[im 80/80]
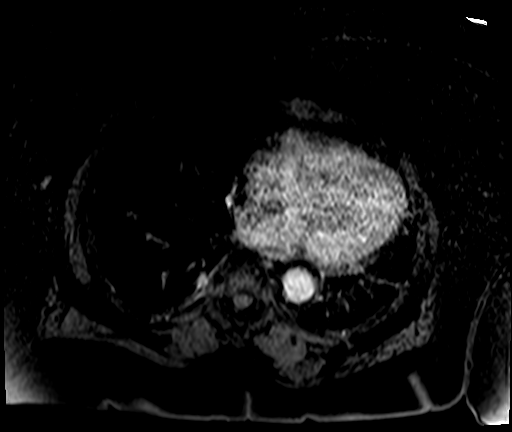

[Series 12: T1 dynamic post-contrast · axial · 2.5mm · 0.74mm/px · z∈[-34,+163]mm · 4 of 80 slices shown (2 of 3)]
[im 1/80]
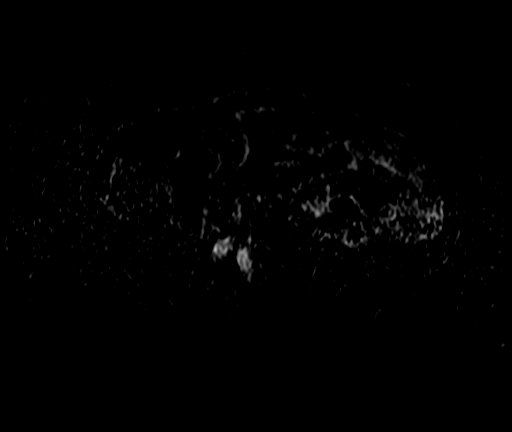
[im 27/80]
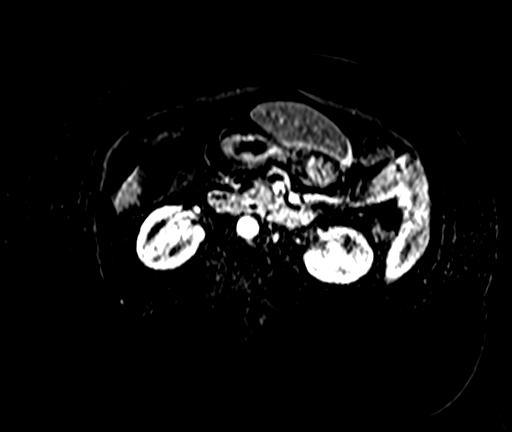
[im 53/80]
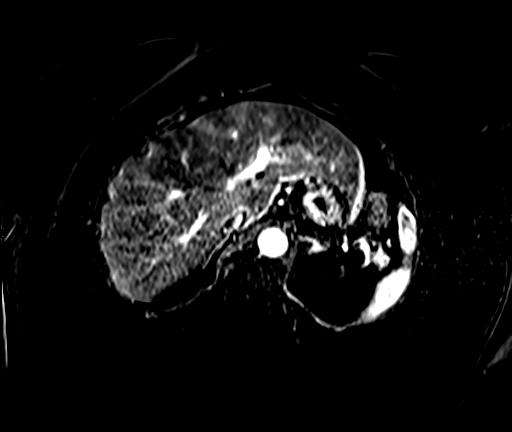
[im 80/80]
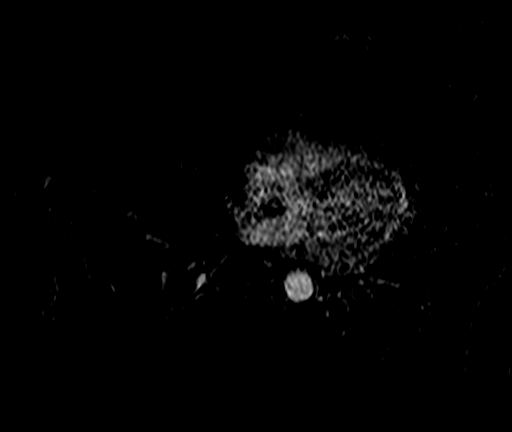

[Series 13: T1 dynamic post-contrast · axial · 2.5mm · 0.74mm/px · z∈[-34,+31]mm · 2 of 80 slices shown (3 of 3)]
[im 1/80]
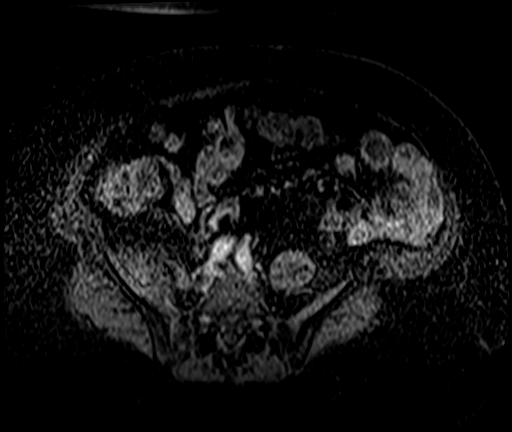
[im 27/80]
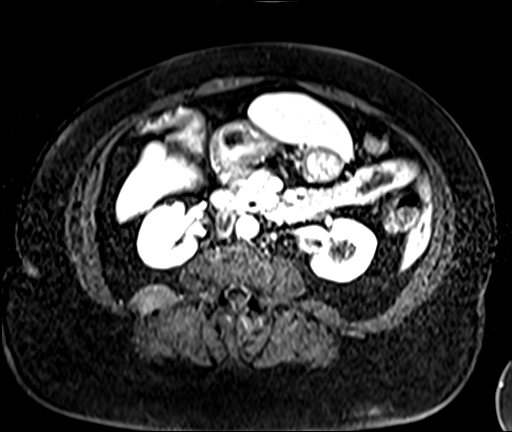

[26 of 48 positions shown; findings below may reference images not displayed]

FINDINGS: Lower chest: No acute findings.

Hepatobiliary: No mass or other parenchymal abnormality identified.

Pancreas: No mass, inflammatory changes, or other parenchymal
abnormality identified.

Spleen:  Within normal limits in size and appearance.

Adrenals/Urinary Tract: No solid masses identified. There are
multiple bilateral renal lesions of varying complexity. A previously
noted septated, enhancing lesion of the posterior superior pole of
the right kidney been surgically resected. There is no suspicious
contrast enhancement or soft tissue in this vicinity. Other lesions
are unchanged, including a cyst of the left renal sinus with
internal contrast enhancing septa measuring 2.7 x 1.9 cm (series 19,
image 53) and a partially exophytic lesion of the medial inferior
pole of the left kidney with a contrast enhancing septation
measuring 1.8 x 1.4 cm (series 19, image 60). There is a benign
hemorrhagic or proteinaceous cyst of the posterior superior pole of
the left kidney (series 10, image 44), as well as multiple
additional bilateral benign simple cysts and subcentimeter fluid
signal lesions too small to completely characterize. No evidence of
hydronephrosis.

Stomach/Bowel: Visualized portions within the abdomen are
unremarkable.

Vascular/Lymphatic: No pathologically enlarged lymph nodes
identified. No abdominal aortic aneurysm demonstrated.

Other:  None.

Musculoskeletal: No suspicious bone lesions identified.
IMPRESSION: 1. Status post interval right superior pole partial nephrectomy. No
evidence of local recurrence, lymphadenopathy, or metastatic disease
in the abdomen.
2. Stable appearance of multiple additional bilateral renal lesions
of varying complexity, the majority of which are benign simple cysts
or too small to fully characterize.
3. Specifically however, a 1.8 cm partially exophytic lesion of the
medial inferior pole of the left kidney with thick internal contrast
enhancing septa remains suspicious for renal cell carcinoma and is a
Bosniak category III lesion.
4. A 2.7 cm thinly septated cyst of the left renal sinus is a
Bosniak category IIF lesion.
5. Recommend follow-up in 6 months to ensure stability.

## 2022-06-16 ENCOUNTER — Other Ambulatory Visit: Payer: Self-pay | Admitting: Urology

## 2022-06-16 DIAGNOSIS — C649 Malignant neoplasm of unspecified kidney, except renal pelvis: Secondary | ICD-10-CM

## 2022-07-22 ENCOUNTER — Encounter: Payer: Self-pay | Admitting: Urology

## 2022-08-04 ENCOUNTER — Encounter: Payer: Self-pay | Admitting: Urology

## 2022-08-07 ENCOUNTER — Ambulatory Visit: Admission: RE | Admit: 2022-08-07 | Payer: Medicare PPO | Source: Ambulatory Visit

## 2022-08-07 ENCOUNTER — Other Ambulatory Visit: Payer: Self-pay | Admitting: Urology

## 2022-08-07 DIAGNOSIS — C649 Malignant neoplasm of unspecified kidney, except renal pelvis: Secondary | ICD-10-CM

## 2022-08-07 MED ORDER — IOPAMIDOL (ISOVUE-300) INJECTION 61%
75.0000 mL | Freq: Once | INTRAVENOUS | Status: AC | PRN
  Administered 2022-08-07: 75 mL via INTRAVENOUS

## 2022-08-13 ENCOUNTER — Other Ambulatory Visit: Payer: Medicare PPO

## 2022-08-19 ENCOUNTER — Encounter (HOSPITAL_COMMUNITY): Payer: Self-pay

## 2022-08-19 ENCOUNTER — Ambulatory Visit (HOSPITAL_COMMUNITY)
Admission: RE | Admit: 2022-08-19 | Discharge: 2022-08-19 | Disposition: A | Discharge status: Home / Self Care | Payer: Medicare PPO | Source: Ambulatory Visit | Attending: Urology | Admitting: Urology

## 2022-08-19 DIAGNOSIS — C649 Malignant neoplasm of unspecified kidney, except renal pelvis: Secondary | ICD-10-CM

## 2022-12-25 ENCOUNTER — Encounter: Payer: Self-pay | Admitting: Urology

## 2022-12-31 ENCOUNTER — Ambulatory Visit
Admission: RE | Admit: 2022-12-31 | Discharge: 2022-12-31 | Disposition: A | Discharge status: Home / Self Care | Payer: Medicare PPO | Source: Ambulatory Visit | Attending: Urology | Admitting: Urology

## 2022-12-31 MED ORDER — GADOPICLENOL 0.5 MMOL/ML IV SOLN
10.0000 mL | Freq: Once | INTRAVENOUS | Status: AC | PRN
  Administered 2022-12-31: 10 mL via INTRAVENOUS

## 2023-06-28 ENCOUNTER — Other Ambulatory Visit: Payer: Self-pay | Admitting: Urology

## 2023-06-28 DIAGNOSIS — D49512 Neoplasm of unspecified behavior of left kidney: Secondary | ICD-10-CM

## 2023-06-28 DIAGNOSIS — C641 Malignant neoplasm of right kidney, except renal pelvis: Secondary | ICD-10-CM

## 2023-08-13 ENCOUNTER — Encounter: Payer: Self-pay | Admitting: Urology

## 2023-08-28 ENCOUNTER — Ambulatory Visit
Admission: RE | Admit: 2023-08-28 | Discharge: 2023-08-28 | Disposition: A | Discharge status: Home / Self Care | Source: Ambulatory Visit | Attending: Urology | Admitting: Urology

## 2023-08-28 DIAGNOSIS — C641 Malignant neoplasm of right kidney, except renal pelvis: Secondary | ICD-10-CM

## 2023-08-28 DIAGNOSIS — D49512 Neoplasm of unspecified behavior of left kidney: Secondary | ICD-10-CM

## 2023-08-28 MED ORDER — GADOPICLENOL 0.5 MMOL/ML IV SOLN
10.0000 mL | Freq: Once | INTRAVENOUS | Status: AC | PRN
  Administered 2023-08-28: 10 mL via INTRAVENOUS
